# Patient Record
Sex: Female | Born: 1965
Health system: Southern US, Community
[De-identification: ages and names within clinical notes are randomized; demographics above are authoritative.]

## PROBLEM LIST (undated history)

## (undated) DIAGNOSIS — J449 Chronic obstructive pulmonary disease, unspecified: Secondary | ICD-10-CM

## (undated) DIAGNOSIS — R079 Chest pain, unspecified: Secondary | ICD-10-CM

## (undated) DIAGNOSIS — F419 Anxiety disorder, unspecified: Secondary | ICD-10-CM

## (undated) HISTORY — PX: BREAST EXCISIONAL BIOPSY: SUR124

## (undated) HISTORY — PX: APPENDECTOMY: SHX54

## (undated) HISTORY — DX: Chest pain, unspecified: R07.9

## (undated) HISTORY — DX: Chronic obstructive pulmonary disease, unspecified: J44.9

## (undated) HISTORY — DX: Anxiety disorder, unspecified: F41.9

## (undated) HISTORY — PX: BREAST SURGERY: SHX581

---

## 1998-07-25 ENCOUNTER — Other Ambulatory Visit: Admission: RE | Admit: 1998-07-25 | Discharge: 1998-07-25 | Payer: Self-pay | Admitting: Gynecology

## 2000-01-17 ENCOUNTER — Other Ambulatory Visit: Admission: RE | Admit: 2000-01-17 | Discharge: 2000-01-17 | Payer: Self-pay | Admitting: Gynecology

## 2001-11-18 ENCOUNTER — Other Ambulatory Visit: Admission: RE | Admit: 2001-11-18 | Discharge: 2001-11-18 | Payer: Self-pay | Admitting: Gynecology

## 2002-06-29 ENCOUNTER — Other Ambulatory Visit: Admission: RE | Admit: 2002-06-29 | Discharge: 2002-06-29 | Payer: Self-pay | Admitting: Gynecology

## 2003-06-07 ENCOUNTER — Encounter: Admission: RE | Admit: 2003-06-07 | Discharge: 2003-06-07 | Payer: Self-pay | Admitting: Family Medicine

## 2003-06-07 ENCOUNTER — Encounter: Payer: Self-pay | Admitting: Family Medicine

## 2003-12-30 ENCOUNTER — Other Ambulatory Visit: Admission: RE | Admit: 2003-12-30 | Discharge: 2003-12-30 | Payer: Self-pay | Admitting: Gynecology

## 2004-05-24 ENCOUNTER — Other Ambulatory Visit: Admission: RE | Admit: 2004-05-24 | Discharge: 2004-05-24 | Payer: Self-pay | Admitting: Gynecology

## 2004-12-04 ENCOUNTER — Inpatient Hospital Stay (HOSPITAL_COMMUNITY): Admission: AD | Admit: 2004-12-04 | Discharge: 2004-12-06 | Payer: Self-pay | Admitting: Gynecology

## 2005-01-26 ENCOUNTER — Other Ambulatory Visit: Admission: RE | Admit: 2005-01-26 | Discharge: 2005-01-26 | Payer: Self-pay | Admitting: Gynecology

## 2005-06-23 ENCOUNTER — Emergency Department (HOSPITAL_COMMUNITY): Admission: EM | Admit: 2005-06-23 | Discharge: 2005-06-23 | Payer: Self-pay | Admitting: Emergency Medicine

## 2005-08-15 ENCOUNTER — Ambulatory Visit: Payer: Self-pay | Admitting: Cardiology

## 2006-01-29 ENCOUNTER — Other Ambulatory Visit: Admission: RE | Admit: 2006-01-29 | Discharge: 2006-01-29 | Payer: Self-pay | Admitting: Gynecology

## 2006-05-06 ENCOUNTER — Encounter: Admission: RE | Admit: 2006-05-06 | Discharge: 2006-05-06 | Payer: Self-pay | Admitting: Gynecology

## 2006-08-28 ENCOUNTER — Other Ambulatory Visit: Admission: RE | Admit: 2006-08-28 | Discharge: 2006-08-28 | Payer: Self-pay | Admitting: Gynecology

## 2006-11-21 ENCOUNTER — Encounter: Admission: RE | Admit: 2006-11-21 | Discharge: 2006-11-21 | Payer: Self-pay | Admitting: Gynecology

## 2007-02-03 ENCOUNTER — Other Ambulatory Visit: Admission: RE | Admit: 2007-02-03 | Discharge: 2007-02-03 | Payer: Self-pay | Admitting: Gynecology

## 2008-02-05 ENCOUNTER — Other Ambulatory Visit: Admission: RE | Admit: 2008-02-05 | Discharge: 2008-02-05 | Payer: Self-pay | Admitting: Gynecology

## 2008-04-19 ENCOUNTER — Emergency Department (HOSPITAL_COMMUNITY): Admission: EM | Admit: 2008-04-19 | Discharge: 2008-04-19 | Payer: Self-pay | Admitting: Family Medicine

## 2009-03-02 ENCOUNTER — Other Ambulatory Visit: Admission: RE | Admit: 2009-03-02 | Discharge: 2009-03-02 | Payer: Self-pay | Admitting: Gynecology

## 2009-03-02 ENCOUNTER — Ambulatory Visit: Payer: Self-pay | Admitting: Gynecology

## 2009-03-02 ENCOUNTER — Encounter: Payer: Self-pay | Admitting: Gynecology

## 2009-09-08 ENCOUNTER — Encounter: Admission: RE | Admit: 2009-09-08 | Discharge: 2009-09-08 | Payer: Self-pay | Admitting: Gynecology

## 2009-11-30 ENCOUNTER — Ambulatory Visit: Payer: Self-pay | Admitting: Gynecology

## 2009-12-02 ENCOUNTER — Ambulatory Visit: Payer: Self-pay | Admitting: Gynecology

## 2010-04-17 ENCOUNTER — Other Ambulatory Visit: Admission: RE | Admit: 2010-04-17 | Discharge: 2010-04-17 | Payer: Self-pay | Admitting: Gynecology

## 2010-04-17 ENCOUNTER — Ambulatory Visit: Payer: Self-pay | Admitting: Gynecology

## 2010-05-19 ENCOUNTER — Encounter: Admission: RE | Admit: 2010-05-19 | Discharge: 2010-05-19 | Payer: Self-pay | Admitting: Gynecology

## 2010-07-20 ENCOUNTER — Emergency Department (HOSPITAL_COMMUNITY): Admission: EM | Admit: 2010-07-20 | Discharge: 2010-07-20 | Payer: Self-pay | Admitting: Obstetrics and Gynecology

## 2010-09-07 ENCOUNTER — Ambulatory Visit: Payer: Self-pay | Admitting: Internal Medicine

## 2010-09-07 DIAGNOSIS — F17200 Nicotine dependence, unspecified, uncomplicated: Secondary | ICD-10-CM | POA: Insufficient documentation

## 2010-09-07 DIAGNOSIS — R0609 Other forms of dyspnea: Secondary | ICD-10-CM | POA: Insufficient documentation

## 2010-09-07 DIAGNOSIS — Z9189 Other specified personal risk factors, not elsewhere classified: Secondary | ICD-10-CM | POA: Insufficient documentation

## 2010-09-07 DIAGNOSIS — F339 Major depressive disorder, recurrent, unspecified: Secondary | ICD-10-CM | POA: Insufficient documentation

## 2010-09-07 DIAGNOSIS — F329 Major depressive disorder, single episode, unspecified: Secondary | ICD-10-CM | POA: Insufficient documentation

## 2010-09-08 ENCOUNTER — Encounter: Payer: Self-pay | Admitting: Internal Medicine

## 2010-09-19 ENCOUNTER — Ambulatory Visit: Payer: Self-pay | Admitting: Internal Medicine

## 2010-09-20 ENCOUNTER — Encounter: Payer: Self-pay | Admitting: Internal Medicine

## 2010-09-21 ENCOUNTER — Ambulatory Visit: Payer: Self-pay | Admitting: Internal Medicine

## 2010-09-21 DIAGNOSIS — J449 Chronic obstructive pulmonary disease, unspecified: Secondary | ICD-10-CM | POA: Insufficient documentation

## 2010-09-21 DIAGNOSIS — J4489 Other specified chronic obstructive pulmonary disease: Secondary | ICD-10-CM | POA: Insufficient documentation

## 2010-11-26 ENCOUNTER — Encounter: Payer: Self-pay | Admitting: Gynecology

## 2010-12-05 NOTE — Assessment & Plan Note (Signed)
Summary: BRAND NEW PT/TO EST/CJR   Vital Signs:  Sophia profile:   45 year old female Height:      64.5 inches Weight:      142 pounds BMI:     24.08 Temp:     99.1 degrees F oral BP sitting:   116 / 78  (right arm) Cuff size:   regular  Vitals Entered By: Duard Brady LPN (September 07, 2010 1:56 PM) CC: new to establish  Is Sophia Sophia? No   CC:  new to establish .  History of Present Illness: Sophia Sophia who is in today to establish with our practice.  She presents with the chief complaint dyspnea on exertion.  She is a tobacco user since age 14.  For the past 3 months, she has had worsening dyspnea on exertion and has cut her tobacco consumption from one pack per day to the proximal effects per day.  She denies any wheezing or productive cough for a number of family members also smokers have COPD and asthma.  Two brothers are welders and one has been told that he has "black lung".  She has had no pulmonary function studies.  She was evaluated at the hospital approximately 6 weeks ago, and a chest x-ray was normal.  She is on birth control pills.  Preventive Screening-Counseling & Management  Alcohol-Tobacco     Smoking Status: current     Smoking Cessation Counseling: yes  Allergies (verified): No Known Drug Allergies  Past History:  Past Medical History: tobacco abuse dyspnea on exertion Depression  Family History: Reviewed history and no changes required. father died age 41, complications of throat cancer mother, age 68, history of COPD, status post CABG two brothers both are welders history of black lung two sisters with COPD and asthma  Social History: Reviewed history and no changes required. Married Current Smoker Smoking Status:  current  Review of Systems       The Sophia complains of dyspnea on exertion.  The Sophia denies anorexia, fever, weight loss, weight gain, vision loss, decreased hearing, hoarseness, chest pain, syncope,  peripheral edema, prolonged cough, headaches, hemoptysis, abdominal pain, melena, hematochezia, severe indigestion/heartburn, hematuria, incontinence, genital sores, muscle weakness, suspicious skin lesions, transient blindness, difficulty walking, depression, unusual weight change, abnormal bleeding, enlarged lymph nodes, angioedema, and breast masses.    Physical Exam  General:  Well-developed,well-nourished,in no acute distress; alert,appropriate and cooperative throughout examination Head:  Normocephalic and atraumatic without obvious abnormalities. No apparent alopecia or balding. Eyes:  No corneal or conjunctival inflammation noted. EOMI. Perrla. Funduscopic exam benign, without hemorrhages, exudates or papilledema. Vision grossly normal. Ears:  External ear exam shows no significant lesions or deformities.  Otoscopic examination reveals clear canals, tympanic membranes are intact bilaterally without bulging, retraction, inflammation or discharge. Hearing is grossly normal bilaterally. Mouth:  Oral mucosa and oropharynx without lesions or exudates.  Teeth in good repair. Neck:  No deformities, masses, or tenderness noted. Chest Wall:  No deformities, masses, or tenderness noted. Lungs:  Normal respiratory effort, chest expands symmetrically. Lungs are clear to auscultation, no crackles or wheezes. Heart:  Normal rate and regular rhythm. S1 and S2 normal without gallop, murmur, click, rub or other extra sounds. Abdomen:  Bowel sounds positive,abdomen soft and non-tender without masses, organomegaly or hernias noted. Msk:  No deformity or scoliosis noted of thoracic or lumbar spine.   Pulses:  R and L carotid,radial,femoral,dorsalis pedis and posterior tibial pulses are full and equal bilaterally Extremities:  No clubbing,  cyanosis, edema, or deformity noted with normal full range of motion of all joints.   Neurologic:  alert & oriented X3, strength normal in all extremities, and gait normal.     Skin:  Intact without suspicious lesions or rashes Cervical Nodes:  No lymphadenopathy noted Axillary Nodes:  No palpable lymphadenopathy Inguinal Nodes:  No significant adenopathy Psych:  Cognition and judgment appear intact. Alert and cooperative with normal attention span and concentration. No apparent delusions, illusions, hallucinations   Impression & Recommendations:  Problem # 1:  DYSPNEA ON EXERTION (ICD-786.09)  Orders: Venipuncture (78469) T-D-Dimer Fibrin Derivatives Quantitive ([id number]) Misc. Referral (Misc. Ref) Specimen Handling (44010)  Her updated medication list for this problem includes:    Proventil Hfa 108 (90 Base) Mcg/act Aers (Albuterol sulfate) .Marland Kitchen..Marland Kitchen Two puffs every 6 hours  Problem # 2:  TOBACCO ABUSE (ICD-305.1)  Problem # 3:  CHEST PAIN, ATYPICAL, HX OF (ICD-V15.89) this he is to occur more at night.  She denies any exertional chest pain.  Mother is status post CABG  Complete Medication List: 1)  Prozac 20 Mg Caps (Fluoxetine hcl) .... Qd 2)  Nora-be 0.35 Mg Tabs (Norethindrone) .... Qd 3)  Proventil Hfa 108 (90 Base) Mcg/act Aers (Albuterol sulfate) .... Two puffs every 6 hours  Other Orders: Tdap => 53yrs IM (27253) Admin 1st Vaccine (66440)  Sophia Instructions: 1)  Please schedule a follow-up appointment in 2 weeks. 2)  Advised not to eat any food or drink any liquids after 10 PM the night before your procedure. 3)  Tobacco is very bad for your health and your loved ones! You Should stop smoking!. Prescriptions: PROVENTIL HFA 108 (90 BASE) MCG/ACT AERS (ALBUTEROL SULFATE) two puffs every 6 hours  #1 x 0   Entered and Authorized by:   Gordy Savers  MD   Signed by:   Gordy Savers  MD on 09/07/2010   Method used:   Print then Give to Sophia   RxID:   [id number] PROVENTIL HFA 108 (90 BASE) MCG/ACT AERS (ALBUTEROL SULFATE) two puffs every 6 hours  #1 x 0   Entered and Authorized by:   Gordy Savers  MD   Signed  by:   Gordy Savers  MD on 09/07/2010   Method used:   Electronically to        [facility]* (retail)       [address]       [place], [place]  [address]       Ph: [phone]       Fax: [phone]   RxID:   [id number]    Orders Added: 1)  Tdap => 64yrs IM [62831] 2)  Admin 1st Vaccine [90471] 3)  New Sophia 40-64 years [99386] 4)  Venipuncture [51761] 5)  T-D-Dimer Fibrin Derivatives Quantitive [[id number]] 6)  Misc. Referral [Misc. Ref] 7)  Specimen Handling [99000]   Immunizations Administered:  Tetanus Vaccine:    Vaccine Type: Tdap    Site: left deltoid    Mfr: GlaxoSmithKline    Dose: 0.5 ml    Route: IM    Given by: Duard Brady LPN    Exp. Date: 08/24/2012    Lot #: [id number]    VIS given: 09/22/08 version given September 07, 2010.    Physician counseled: yes   Immunizations Administered:  Tetanus Vaccine:    Vaccine Type: Tdap    Site: left deltoid    Mfr: GlaxoSmithKline    Dose: 0.5 ml    Route:  IM    Given by: Duard Brady LPN    Exp. Date: 08/24/2012    Lot #: [id number]    VIS given: 09/22/08 version given September 07, 2010.    Physician counseled: yes

## 2010-12-05 NOTE — Miscellaneous (Signed)
Summary: Orders Update pft charges  Clinical Lists Changes  Orders: Added new Service order of Carbon Monoxide diffusing w/capacity (94720) - Signed Added new Service order of Lung Volumes (94240) - Signed Added new Service order of Spirometry (Pre & Post) (94060) - Signed 

## 2010-12-05 NOTE — Assessment & Plan Note (Signed)
Summary: 2 wk rov/njr   Vital Signs:  Patient profile:   45 year old female Weight:      142 pounds Temp:     98.0 degrees F oral BP sitting:   102 / 68  (left arm) Cuff size:   regular  Vitals Entered By: Duard Brady LPN (September 21, 2010 8:36 AM) CC: 2 wk rov - doing ok , c/o headache Is Patient Diabetic? No   CC:  2 wk rov - doing ok  and c/o headache.  History of Present Illness:  45 year old patient who is seen today for follow-up of her dyspnea on exertion.  Pulmonary  function studies were performed yesterday that revealed moderate COPD.  She continues to smoke, but has cut her consumption dramatically.  Pulmonary function studies revealed some improvement with bronchodilators and the patient clinically has had improvement with her metered-dose inhaler  Preventive Screening-Counseling & Management  Alcohol-Tobacco     Smoking Cessation Counseling: yes  Allergies (verified): No Known Drug Allergies  Past History:  Past Medical History: Reviewed history from 09/07/2010 and no changes required. tobacco abuse dyspnea on exertion Depression  Review of Systems       The patient complains of dyspnea on exertion.  The patient denies anorexia, fever, weight loss, weight gain, vision loss, decreased hearing, hoarseness, chest pain, syncope, peripheral edema, prolonged cough, headaches, hemoptysis, abdominal pain, melena, hematochezia, severe indigestion/heartburn, hematuria, incontinence, genital sores, muscle weakness, suspicious skin lesions, transient blindness, difficulty walking, depression, unusual weight change, abnormal bleeding, enlarged lymph nodes, angioedema, and breast masses.    Physical Exam  General:  Well-developed,well-nourished,in no acute distress; alert,appropriate and cooperative throughout examination Neck:  No deformities, masses, or tenderness noted. Lungs:  Normal respiratory effort, chest expands symmetrically. Lungs are clear to  auscultation, no crackles or wheezes. O2 saturation 98% Heart:  Normal rate and regular rhythm. S1 and S2 normal without gallop, murmur, click, rub or other extra sounds. pulse rate 64   Impression & Recommendations:  Problem # 1:  DYSPNEA ON EXERTION (ICD-786.09)  Her updated medication list for this problem includes:    Proventil Hfa 108 (90 Base) Mcg/act Aers (Albuterol sulfate) .Marland Kitchen..Marland Kitchen Two puffs every 6 hours    Dulera 100-5 Mcg/act Aero (Mometasone furo-formoterol fum) ..... One puff twice daily  Problem # 2:  CHRONIC OBSTRUCTIVE PULMONARY DISEASE (ICD-496)  Her updated medication list for this problem includes:    Proventil Hfa 108 (90 Base) Mcg/act Aers (Albuterol sulfate) .Marland Kitchen..Marland Kitchen Two puffs every 6 hours    Dulera 100-5 Mcg/act Aero (Mometasone furo-formoterol fum) ..... One puff twice daily the importance of total, and complete smoking cessation discussed at length  Complete Medication List: 1)  Prozac 20 Mg Caps (Fluoxetine hcl) .... Qd 2)  Nora-be 0.35 Mg Tabs (Norethindrone) .... Qd 3)  Proventil Hfa 108 (90 Base) Mcg/act Aers (Albuterol sulfate) .... Two puffs every 6 hours 4)  Dulera 100-5 Mcg/act Aero (Mometasone furo-formoterol fum) .... One puff twice daily  Patient Instructions: 1)  Please schedule a follow-up appointment in 6 months. 2)  Tobacco is very bad for your health and your loved ones! You Should stop smoking!. 3)  It is important that you exercise regularly at least 20 minutes 5 times a week. If you develop chest pain, have severe difficulty breathing, or feel very tired , stop exercising immediately and seek medical attention. Prescriptions: PROVENTIL HFA 108 (90 BASE) MCG/ACT AERS (ALBUTEROL SULFATE) two puffs every 6 hours  #1 x 3   Entered and  Authorized by:   Gordy Savers  MD   Signed by:   Gordy Savers  MD on 09/21/2010   Method used:   Electronically to        [facility]* (retail)       [address]       [place], [place]   [address]       Ph: [phone]       Fax: [phone]   RxID:   [id number] DULERA 100-5 MCG/ACT AERO (MOMETASONE FURO-FORMOTEROL FUM) one puff twice daily  #1 x 4   Entered and Authorized by:   Gordy Savers  MD   Signed by:   Gordy Savers  MD on 09/21/2010   Method used:   Electronically to        [facility]* (retail)       [address]       [place], [place]  [address]       Ph: [phone]       Fax: [phone]   RxID:   [id number]    Orders Added: 1)  Est. Patient Level III [88416]

## 2010-12-28 ENCOUNTER — Encounter: Payer: Self-pay | Admitting: Internal Medicine

## 2010-12-28 ENCOUNTER — Ambulatory Visit (INDEPENDENT_AMBULATORY_CARE_PROVIDER_SITE_OTHER): Payer: 59 | Admitting: Internal Medicine

## 2010-12-28 DIAGNOSIS — J329 Chronic sinusitis, unspecified: Secondary | ICD-10-CM | POA: Insufficient documentation

## 2010-12-28 MED ORDER — DOXYCYCLINE HYCLATE 100 MG PO TABS: 100.0000 mg | ORAL_TABLET | Freq: Two times a day (BID) | ORAL | Status: AC

## 2010-12-28 NOTE — Progress Notes (Signed)
  Subjective:    Patient ID: Sophia Vance, female    DOB: [birth year], 45 y.o.   MRN: [id number]  HPI Pt presents to clinic for evaluation of URI sx's. Notes 10 day h/o nasal congestion, nasal drainage and now over last several days has developed NP cough, headache, right maxillary sinus pain and green nasal discharge.  Denies fever, chills, dyspnea or wheezing. +sick exposures. Taking otc cold medication prn without significant improvement. No alleviating or exacerbating factors. Does use tobacco.   Review of Systems See HPI     Objective:   Physical Exam  Constitutional: She appears well-developed and well-nourished. No distress.  HENT:  Head: Normocephalic and atraumatic.  Right Ear: Tympanic membrane, external ear and ear canal normal.  Left Ear: Tympanic membrane, external ear and ear canal normal.  Nose: Nose normal.  Mouth/Throat: Oropharynx is clear and moist. No oropharyngeal exudate.  Eyes: Conjunctivae are normal. Right eye exhibits no discharge. Left eye exhibits no discharge. No scleral icterus.  Neck: Neck supple.  Cardiovascular: Normal rate, regular rhythm and normal heart sounds.  Exam reveals no gallop and no friction rub.   No murmur heard. Pulmonary/Chest: Effort normal and breath sounds normal. No respiratory distress. She has no wheezes. She has no rales.  Lymphadenopathy:    She has no cervical adenopathy.  Neurological: She is alert.  Skin: Skin is warm and dry. No rash noted. She is not diaphoretic. No erythema.          Assessment & Plan:

## 2010-12-28 NOTE — Assessment & Plan Note (Signed)
Begin doxycycline 100mg  po bid x 10d. Discussed appropriate otc symptom relief prn. Followup if no improvement or worsening.

## 2011-01-18 LAB — POCT CARDIAC MARKERS: CKMB, poc: <1 ng/mL — ABNORMAL LOW (ref 1.0–8.0)

## 2011-01-18 LAB — COMPREHENSIVE METABOLIC PANEL
ALT: 11 U/L (ref 0–35)
AST: 19 U/L (ref 0–37)
BUN: 9 mg/dL (ref 6–23)
CO2: 21 mEq/L (ref 19–32)
Calcium: 8.8 mg/dL (ref 8.4–10.5)
Creatinine, Ser: 0.79 mg/dL (ref 0.4–1.2)
GFR calc Af Amer: >60 mL/min (ref >60)
Potassium: 3.3 mEq/L — ABNORMAL LOW (ref 3.5–5.1)
Total Bilirubin: 0.6 mg/dL (ref 0.3–1.2)

## 2011-01-18 LAB — CBC
HCT: 38.3 % (ref 36.0–46.0)
Hemoglobin: 13.4 g/dL (ref 12.0–15.0)
MCHC: 35 g/dL (ref 30.0–36.0)
MCV: 89.3 fL (ref 78.0–100.0)
WBC: 9.7 10*3/uL (ref 4.0–10.5)

## 2011-03-22 ENCOUNTER — Ambulatory Visit (INDEPENDENT_AMBULATORY_CARE_PROVIDER_SITE_OTHER): Payer: 59 | Admitting: Internal Medicine

## 2011-03-22 ENCOUNTER — Encounter: Payer: Self-pay | Admitting: Internal Medicine

## 2011-03-22 DIAGNOSIS — J449 Chronic obstructive pulmonary disease, unspecified: Secondary | ICD-10-CM

## 2011-03-22 DIAGNOSIS — F3289 Other specified depressive episodes: Secondary | ICD-10-CM

## 2011-03-22 DIAGNOSIS — J4489 Other specified chronic obstructive pulmonary disease: Secondary | ICD-10-CM

## 2011-03-22 DIAGNOSIS — F329 Major depressive disorder, single episode, unspecified: Secondary | ICD-10-CM

## 2011-03-22 DIAGNOSIS — F17200 Nicotine dependence, unspecified, uncomplicated: Secondary | ICD-10-CM

## 2011-03-22 MED ORDER — FLUOXETINE HCL 20 MG PO TABS: 20.0000 mg | ORAL_TABLET | Freq: Every day | ORAL | Status: DC

## 2011-03-22 NOTE — Progress Notes (Signed)
  Subjective:    Patient ID: Sophia Vance, female    DOB: 02-Sep-1966, 45 y.o.   MRN: [id number]  HPI 45 year old patient who is seen today for followup. She has a history of ongoing tobacco use. She has been on maintenance Dulera which she feels has been of great benefit. She still gets mildly short of breath with activity and when she gets stressed. Denies any real active wheezing. She continues to cut her tobacco use and presently is down to 3 or 4 cigarettes daily. She is using a nicotine patch. She has a history of depression which has a well-controlled on Prozac. She has been on this medication since March of 2006. She feels that she still receives denies benefit. No new concerns or complaints denies any active wheezing   Review of Systems  Constitutional: Negative.   HENT: Negative for hearing loss, congestion, sore throat, rhinorrhea, dental problem, sinus pressure and tinnitus.   Eyes: Negative for pain, discharge and visual disturbance.  Respiratory: Positive for shortness of breath. Negative for cough.   Cardiovascular: Negative for chest pain, palpitations and leg swelling.  Gastrointestinal: Negative for nausea, vomiting, abdominal pain, diarrhea, constipation, blood in stool and abdominal distention.  Genitourinary: Negative for dysuria, urgency, frequency, hematuria, flank pain, vaginal bleeding, vaginal discharge, difficulty urinating, vaginal pain and pelvic pain.  Musculoskeletal: Negative for joint swelling, arthralgias and gait problem.  Skin: Negative for rash.  Neurological: Negative for dizziness, syncope, speech difficulty, weakness, numbness and headaches.  Hematological: Negative for adenopathy.  Psychiatric/Behavioral: Negative for behavioral problems, dysphoric mood and agitation. The patient is not nervous/anxious.        Objective:   Physical Exam  Constitutional: She is oriented to person, place, and time. She appears well-developed and well-nourished.  HENT:    Head: Normocephalic.  Right Ear: External ear normal.  Left Ear: External ear normal.  Mouth/Throat: Oropharynx is clear and moist.  Eyes: Conjunctivae and EOM are normal. Pupils are equal, round, and reactive to light.  Neck: Normal range of motion. Neck supple. No thyromegaly present.  Cardiovascular: Normal rate, regular rhythm, normal heart sounds and intact distal pulses.   Pulmonary/Chest: Effort normal and breath sounds normal. No respiratory distress. She has no wheezes. She has no rales.       O2 saturation 99%  Abdominal: Soft. Bowel sounds are normal. She exhibits no mass. There is no tenderness.  Musculoskeletal: Normal range of motion.  Lymphadenopathy:    She has no cervical adenopathy.  Neurological: She is alert and oriented to person, place, and time.  Skin: Skin is warm and dry. No rash noted.  Psychiatric: She has a normal mood and affect. Her behavior is normal.          Assessment & Plan:   Depression stable Ongoing tobacco use.  Improved  We'll continue present regimen. Samples of Dulera dispensed. He was suggested that she give herself a trial off this medication

## 2011-03-22 NOTE — Patient Instructions (Signed)
Smoking tobacco is very bad for your health. You should stop smoking immediately.    It is important that you exercise regularly, at least 20 minutes 3 to 4 times per week.  If you develop chest pain or shortness of breath seek  medical attention.  Return in 6 months for follow-up  

## 2011-05-15 ENCOUNTER — Other Ambulatory Visit: Payer: Self-pay | Admitting: Gynecology

## 2011-05-15 ENCOUNTER — Encounter (INDEPENDENT_AMBULATORY_CARE_PROVIDER_SITE_OTHER): Payer: 59 | Admitting: Gynecology

## 2011-05-15 ENCOUNTER — Other Ambulatory Visit (HOSPITAL_COMMUNITY)
Admission: RE | Admit: 2011-05-15 | Discharge: 2011-05-15 | Disposition: A | Discharge status: Home / Self Care | Payer: 59 | Source: Ambulatory Visit | Attending: Gynecology | Admitting: Gynecology

## 2011-05-15 DIAGNOSIS — Z124 Encounter for screening for malignant neoplasm of cervix: Secondary | ICD-10-CM | POA: Insufficient documentation

## 2011-05-15 DIAGNOSIS — Z01419 Encounter for gynecological examination (general) (routine) without abnormal findings: Secondary | ICD-10-CM

## 2011-05-15 DIAGNOSIS — Z13220 Encounter for screening for lipoid disorders: Secondary | ICD-10-CM

## 2011-05-15 DIAGNOSIS — Z833 Family history of diabetes mellitus: Secondary | ICD-10-CM

## 2011-08-02 LAB — DIFFERENTIAL
Basophils Absolute: 0.1
Eosinophils Absolute: 0.1
Eosinophils Relative: 1
Lymphocytes Relative: 27
Monocytes Absolute: 0.4
Monocytes Relative: 4
Neutro Abs: 7.4
Neutrophils Relative %: 67

## 2011-08-02 LAB — POCT URINALYSIS DIP (DEVICE)
Bilirubin Urine: NEGATIVE
Glucose, UA: NEGATIVE
Hgb urine dipstick: NEGATIVE
Protein, ur: NEGATIVE
Urobilinogen, UA: 0.2

## 2011-08-02 LAB — CBC
HCT: 40
Hemoglobin: 13.8
MCHC: 34.6
MCV: 91.4
RBC: 4.37

## 2011-08-02 LAB — POCT I-STAT, CHEM 8
BUN: 6
Creatinine, Ser: 0.8
Sodium: 139
TCO2: 25

## 2012-07-11 ENCOUNTER — Encounter: Payer: Self-pay | Admitting: Gynecology

## 2012-07-11 ENCOUNTER — Ambulatory Visit (INDEPENDENT_AMBULATORY_CARE_PROVIDER_SITE_OTHER): Payer: 59 | Admitting: Gynecology

## 2012-07-11 VITALS — BP 122/74 | Ht 65.0 in | Wt 149.0 lb

## 2012-07-11 DIAGNOSIS — R350 Frequency of micturition: Secondary | ICD-10-CM

## 2012-07-11 DIAGNOSIS — Z131 Encounter for screening for diabetes mellitus: Secondary | ICD-10-CM

## 2012-07-11 DIAGNOSIS — F411 Generalized anxiety disorder: Secondary | ICD-10-CM

## 2012-07-11 DIAGNOSIS — IMO0001 Reserved for inherently not codable concepts without codable children: Secondary | ICD-10-CM

## 2012-07-11 DIAGNOSIS — Z01419 Encounter for gynecological examination (general) (routine) without abnormal findings: Secondary | ICD-10-CM

## 2012-07-11 DIAGNOSIS — Z13220 Encounter for screening for lipoid disorders: Secondary | ICD-10-CM

## 2012-07-11 DIAGNOSIS — F419 Anxiety disorder, unspecified: Secondary | ICD-10-CM

## 2012-07-11 DIAGNOSIS — N63 Unspecified lump in unspecified breast: Secondary | ICD-10-CM

## 2012-07-11 LAB — CBC WITH DIFFERENTIAL/PLATELET
Basophils Absolute: 0 10*3/uL (ref 0.0–0.1)
Eosinophils Absolute: 0.1 10*3/uL (ref 0.0–0.7)
Hemoglobin: 13.7 g/dL (ref 12.0–15.0)
Lymphocytes Relative: 26 % (ref 12–46)
MCHC: 34.9 g/dL (ref 30.0–36.0)
MCV: 88.1 fL (ref 78.0–100.0)
Neutrophils Relative %: 67 % (ref 43–77)
Platelets: 310 10*3/uL (ref 150–400)
RBC: 4.45 MIL/uL (ref 3.87–5.11)
RDW: 12.6 % (ref 11.5–15.5)
WBC: 10.7 10*3/uL — ABNORMAL HIGH (ref 4.0–10.5)

## 2012-07-11 LAB — URINALYSIS W MICROSCOPIC + REFLEX CULTURE
Bilirubin Urine: NEGATIVE
Crystals: NONE SEEN
Ketones, ur: NEGATIVE mg/dL
Protein, ur: NEGATIVE mg/dL
RBC / HPF: NONE SEEN RBC/hpf (ref <3)
WBC, UA: NONE SEEN WBC/hpf (ref <3)

## 2012-07-11 LAB — LIPID PANEL
HDL: 42 mg/dL (ref >39)
LDL Cholesterol: 112 mg/dL — ABNORMAL HIGH (ref 0–99)

## 2012-07-11 MED ORDER — FLUOXETINE HCL 20 MG PO TABS: 20.0000 mg | ORAL_TABLET | Freq: Every day | ORAL | Status: DC

## 2012-07-11 MED ORDER — FLUOXETINE HCL 10 MG PO TABS: 10.0000 mg | ORAL_TABLET | Freq: Every day | ORAL | Status: DC

## 2012-07-11 NOTE — Patient Instructions (Signed)
Call to Schedule your mammogram  Facilities in [place]: 1)  [facility] [place], [address] Phone: [phone] 2)  [facility], [phone] Phone: [phone] 3)  Dr. Yolanda Bonine at [facility] [address] Phone: [phone]     Mammogram A mammogram is an X-ray test to find changes in a woman's breast. You should get a mammogram if:  You are 63 years of age or older  You have risk factors.   Your doctor recommends that you have one.  BEFORE THE TEST  Do not schedule the test the week before your period, especially if your breasts are sore during this time.  On the day of your mammogram:  Wash your breasts and armpits well. After washing, do not put on any deodorant or talcum powder on until after your test.   Eat and drink as you usually do.   Take your medicines as usual.   If you are diabetic and take insulin, make sure you:   Eat before coming for your test.   Take your insulin as usual.   If you cannot keep your appointment, call before the appointment to cancel. Schedule another appointment.  TEST  You will need to undress from the waist up. You will put on a hospital gown.   Your breast will be put on the mammogram machine, and it will press firmly on your breast with a piece of plastic called a compression paddle. This will make your breast flatter so that the machine can X-ray all parts of your breast.   Both breasts will be X-rayed. Each breast will be X-rayed from above and from the side. An X-ray might need to be taken again if the picture is not good enough.   The mammogram will last about 15 to 30 minutes.  AFTER THE TEST Finding out the results of your test Ask when your test results will be ready. Make sure you get your test results.  Document Released: 01/18/2009 Document Revised: 10/11/2011 Document Reviewed: 01/18/2009 [facility] Patient  Information 2012 [place], [place].  Consider Stop Smoking.  Help is available at [facility] smoking cessation program @ www.[place].com or 725-396-1265. OR 1-800-QUIT-NOW 413-347-6182) for free smoking cessation counseling.   Smoking Hazards Smoking cigarettes is extremely bad for your health. Tobacco smoke has over 200 known poisons in it. There are over 60 chemicals in tobacco smoke that cause cancer. Some of the chemicals found in cigarette smoke include:  Cyanide.  Benzene.  Formaldehyde.  Methanol (wood alcohol).  Acetylene (fuel used in welding torches).  Ammonia.  Cigarette smoke also contains the poisonous gases nitrogen oxide and carbon monoxide.  Cigarette smokers have an increased risk of many serious medical problems, including: Lung cancer.  Lung disease (such as pneumonia, bronchitis, and emphysema).  Heart attack and chest pain due to the heart not getting enough oxygen (angina).  Heart disease and peripheral blood vessel disease.  Hypertension.  Stroke.  Oral cancer (cancer of the lip, mouth, or voice box).  Bladder cancer.  Pancreatic cancer.  Cervical cancer.  Pregnancy complications, including premature birth.  Low birthweight babies.  Early menopause.  Lower estrogen level for women.  Infertility.  Facial wrinkles.  Blindness.  Increased risk of broken bones (fractures).  Senile dementia.  Stillbirths and smaller newborn babies, birth defects, and genetic damage to sperm.  Stomach ulcers and internal bleeding.  Children of smokers have an  increased risk of the following, because of secondhand smoke exposure:  Sudden infant death syndrome (SIDS).  Respiratory infections.  Lung cancer.  Heart disease.  Ear infections.  Smoking causes approximately: 90% of all lung cancer deaths in men.  80% of all lung cancer deaths in women.  90% of deaths from chronic obstructive lung disease.  Compared with nonsmokers, smoking increases the risk  of: Coronary heart disease by 2 to 4 times.  Stroke by 2 to 4 times.  Men developing lung cancer by 23 times.  Women developing lung cancer by 13 times.  Dying from chronic obstructive lung diseases by 12 times.  Someone who smokes 2 packs a day loses about 8 years of his or her life. Even smoking lightly shortens your life expectancy by several years. You can greatly reduce the risk of medical problems for you and your family by stopping now. Smoking is the most preventable cause of death and disease in our society. Within days of quitting smoking, your circulation returns to normal, you decrease the risk of having a heart attack, and your lung capacity improves. There may be some increased phlegm in the first few days after quitting, and it may take months for your lungs to clear up completely. Quitting for 10 years cuts your lung cancer risk to almost that of a nonsmoker. WHY IS SMOKING ADDICTIVE? Nicotine is the chemical agent in tobacco that is capable of causing addiction or dependence.  When you smoke and inhale, nicotine is absorbed rapidly into the bloodstream through your lungs. Nicotine absorbed through the lungs is capable of creating a powerful addiction. Both inhaled and non-inhaled nicotine may be addictive.  Addiction studies of cigarettes and spit tobacco show that addiction to nicotine occurs mainly during the teen years, when young people begin using tobacco products.  WHAT ARE THE BENEFITS OF QUITTING?  There are many health benefits to quitting smoking.  Likelihood of developing cancer and heart disease decreases. Health improvements are seen almost immediately.  Blood pressure, pulse rate, and breathing patterns start returning to normal soon after quitting.  People who quit may see an improvement in their overall quality of life.  Some people choose to quit all at once. Other options include nicotine replacement products, such as patches, gum, and nasal sprays. Do not use these  products without first checking with your caregiver. QUITTING SMOKING It is not easy to quit smoking. Nicotine is addicting, and longtime habits are hard to change. To start, you can write down all your reasons for quitting, tell your family and friends you want to quit, and ask for their help. Throw your cigarettes away, chew gum or cinnamon sticks, keep your hands busy, and drink extra water or juice. Go for walks and practice deep breathing to relax. Think of all the money you are saving: around $1,000 a year, for the average pack-a-day smoker. Nicotine patches and gum have been shown to improve success at efforts to stop smoking. Zyban (bupropion) is an anti-depressant drug that can be prescribed to reduce nicotine withdrawal symptoms and to suppress the urge to smoke. Smoking is an addiction with both physical and psychological effects. Joining a stop-smoking support group can help you cope with the emotional issues. For more information and advice on programs to stop smoking, call your doctor, your local hospital, or these organizations: American Lung Association - 1-800-LUNGUSA  American Cancer Society - 1-800-ACS-2345  Document Released: 11/29/2004 Document Revised: 07/04/2011 Document Reviewed: 08/03/2009 [facility] Patient Information 2012 [place], [place].  Smoking Cessation This document explains the best ways for you to quit smoking and new treatments to help. It lists new medicines that can double or triple your chances of quitting and quitting for good. It also considers ways to avoid relapses and concerns you may have about quitting, including weight gain. NICOTINE: A POWERFUL ADDICTION If you have tried to quit smoking, you know how hard it can be. It is hard because nicotine is a very addictive drug. For some people, it can be as addictive as heroin or cocaine. Usually, people make 2 or 3 tries, or more, before finally being able to quit. Each time you try to quit, you can learn about what  helps and what hurts. Quitting takes hard work and a lot of effort, but you can quit smoking. QUITTING SMOKING IS ONE OF THE MOST IMPORTANT THINGS YOU WILL EVER DO.  You will live longer, feel better, and live better.   The impact on your body of quitting smoking is felt almost immediately:   Within 20 minutes, blood pressure decreases. Pulse returns to its normal level.   After 8 hours, carbon monoxide levels in the blood return to normal. Oxygen level increases.   After 24 hours, chance of heart attack starts to decrease. Breath, hair, and body stop smelling like smoke.   After 48 hours, damaged nerve endings begin to recover. Sense of taste and smell improve.   After 72 hours, the body is virtually free of nicotine. Bronchial tubes relax and breathing becomes easier.   After 2 to 12 weeks, lungs can hold more air. Exercise becomes easier and circulation improves.   Quitting will reduce your risk of having a heart attack, stroke, cancer, or lung disease:   After 1 year, the risk of coronary heart disease is cut in half.   After 5 years, the risk of stroke falls to the same as a nonsmoker.   After 10 years, the risk of lung cancer is cut in half and the risk of other cancers decreases significantly.   After 15 years, the risk of coronary heart disease drops, usually to the level of a nonsmoker.   If you are pregnant, quitting smoking will improve your chances of having a healthy baby.   The people you live with, especially your children, will be healthier.   You will have extra money to spend on things other than cigarettes.  FIVE KEYS TO QUITTING Studies have shown that these 5 steps will help you quit smoking and quit for good. You have the best chances of quitting if you use them together: 1. Get ready.  2. Get support and encouragement.  3. Learn new skills and behaviors.  4. Get medicine to reduce your nicotine addiction and use it correctly.  5. Be prepared for relapse  or difficult situations. Be determined to continue trying to quit, even if you do not succeed at first.  1. GET READY  Set a quit date.   Change your environment.   Get rid of ALL cigarettes, ashtrays, matches, and lighters in your home, car, and place of work.   Do not let people smoke in your home.   Review your past attempts to quit. Think about what worked and what did not.   Once you quit, do not smoke. NOT EVEN A PUFF!  2. GET SUPPORT AND ENCOURAGEMENT Studies have shown that you have a better chance of being successful if you have help. You can get support in many ways.  Tell your family, friends, and coworkers that you are going to quit and need their support. Ask them not to smoke around you.   Talk to your caregivers (doctor, dentist, nurse, pharmacist, psychologist, and/or smoking counselor).   Get individual, group, or telephone counseling and support. The more counseling you have, the better your chances are of quitting. Programs are available at [employer] and health centers. Call your local health department for information about programs in your area.   Spiritual beliefs and practices may help some smokers quit.   Quit meters are Photographer that keep track of quit statistics, such as amount of "quit-time," cigarettes not smoked, and money saved.   Many smokers find one or more of the many self-help books available useful in helping them quit and stay off tobacco.  3. LEARN NEW SKILLS AND BEHAVIORS  Try to distract yourself from urges to smoke. Talk to someone, go for a walk, or occupy your time with a task.   When you first try to quit, change your routine. Take a different route to work. Drink tea instead of coffee. Eat breakfast in a different place.   Do something to reduce your stress. Take a hot bath, exercise, or read a book.   Plan something enjoyable to do every day. Reward yourself for not smoking.   Explore  interactive web-based programs that specialize in helping you quit.  4. GET MEDICINE AND USE IT CORRECTLY Medicines can help you stop smoking and decrease the urge to smoke. Combining medicine with the above behavioral methods and support can quadruple your chances of successfully quitting smoking. The U.S. Food and Drug Administration (FDA) has approved 7 medicines to help you quit smoking. These medicines fall into 3 categories.  Nicotine replacement therapy (delivers nicotine to your body without the negative effects and risks of smoking):   Nicotine gum: Available over-the-counter.   Nicotine lozenges: Available over-the-counter.   Nicotine inhaler: Available by prescription.   Nicotine nasal spray: Available by prescription.   Nicotine skin patches (transdermal): Available by prescription and over-the-counter.   Antidepressant medicine (helps people abstain from smoking, but how this works is unknown):   Bupropion sustained-release (SR) tablets: Available by prescription.   Nicotinic receptor partial agonist (simulates the effect of nicotine in your brain):   Varenicline tartrate tablets: Available by prescription.   Ask your caregiver for advice about which medicines to use and how to use them. Carefully read the information on the package.   Everyone who is trying to quit may benefit from using a medicine. If you are pregnant or trying to become pregnant, nursing an infant, you are under age 8, or you smoke fewer than 10 cigarettes per day, talk to your caregiver before taking any nicotine replacement medicines.   You should stop using a nicotine replacement product and call your caregiver if you experience nausea, dizziness, weakness, vomiting, fast or irregular heartbeat, mouth problems with the lozenge or gum, or redness or swelling of the skin around the patch that does not go away.   Do not use any other product containing nicotine while using a nicotine replacement  product.   Talk to your caregiver before using these products if you have diabetes, heart disease, asthma, stomach ulcers, you had a recent heart attack, you have high blood pressure that is not controlled with medicine, a history of irregular heartbeat, or you have been prescribed medicine to help you quit smoking.  5. BE PREPARED FOR RELAPSE  OR DIFFICULT SITUATIONS  Most relapses occur within the first 3 months after quitting. Do not be discouraged if you start smoking again. Remember, most people try several times before they finally quit.   You may have symptoms of withdrawal because your body is used to nicotine. You may crave cigarettes, be irritable, feel very hungry, cough often, get headaches, or have difficulty concentrating.   The withdrawal symptoms are only temporary. They are strongest when you first quit, but they will go away within 10 to 14 days.  Here are some difficult situations to watch for:  Alcohol. Avoid drinking alcohol. Drinking lowers your chances of successfully quitting.   Caffeine. Try to reduce the amount of caffeine you consume. It also lowers your chances of successfully quitting.   Other smokers. Being around smoking can make you want to smoke. Avoid smokers.   Weight gain. Many smokers will gain weight when they quit, usually less than 10 pounds. Eat a healthy diet and stay active. Do not let weight gain distract you from your main goal, quitting smoking. Some medicines that help you quit smoking may also help delay weight gain. You can always lose the weight gained after you quit.   Bad mood or depression. There are a lot of ways to improve your mood other than smoking.  If you are having problems with any of these situations, talk to your caregiver. SPECIAL SITUATIONS AND CONDITIONS Studies suggest that everyone can quit smoking. Your situation or condition can give you a special reason to quit.  Pregnant women/new mothers: By quitting, you protect your  baby's health and your own.   Hospitalized patients: By quitting, you reduce health problems and help healing.   Heart attack patients: By quitting, you reduce your risk of a second heart attack.   Lung, head, and neck cancer patients: By quitting, you reduce your chance of a second cancer.   Parents of children and adolescents: By quitting, you protect your children from illnesses caused by secondhand smoke.  QUESTIONS TO THINK ABOUT Think about the following questions before you try to stop smoking. You may want to talk about your answers with your caregiver.  Why do you want to quit?   If you tried to quit in the past, what helped and what did not?   What will be the most difficult situations for you after you quit? How will you plan to handle them?   Who can help you through the tough times? Your family? Friends? Caregiver?   What pleasures do you get from smoking? What ways can you still get pleasure if you quit?  Here are some questions to ask your caregiver:  How can you help me to be successful at quitting?   What medicine do you think would be best for me and how should I take it?   What should I do if I need more help?   What is smoking withdrawal like? How can I get information on withdrawal?  Quitting takes hard work and a lot of effort, but you can quit smoking. FOR MORE INFORMATION  Smokefree.gov (http://www.davis-sullivan.com/) provides free, accurate, evidence-based information and professional assistance to help support the immediate and long-term needs of people trying to quit smoking. Document Released: 10/16/2001 Document Revised: 07/04/2011 Document Reviewed: 08/08/2009 [facility] Patient Information 2012 [place], [place].

## 2012-07-11 NOTE — Progress Notes (Signed)
Makinzie Considine [birth year] [id number]        46 y.o.  G1P1 for annual exam.  Several issues noted below.  Past medical history,surgical history, medications, allergies, family history and social history were all reviewed and documented in the EPIC chart. ROS:  Was performed and pertinent positives and negatives are included in the history.  Exam: Sherrilyn Rist assistant Filed Vitals:   07/11/12 1226  BP: 122/74  Height: 5\' 5"  (1.651 m)  Weight: 149 lb (67.586 kg)   General appearance  Normal Skin grossly normal Head/Neck normal with no cervical or supraclavicular adenopathy thyroid normal Lungs  clear Cardiac RR, without RMG Abdominal  soft, nontender, without masses, organomegaly or hernia Breasts  examined lying and sitting. Right without masses, retractions, discharge or axillary adenopathy.  Left with firm mass 6:00 position periphery of breast at junction of chest wall. No overlying skin changes nipple discharge or axillary adenopathy. Pelvic  Ext/BUS/vagina  normal   Cervix  normal  Uterus  anteverted, normal size, shape and contour, midline and mobile nontender   Adnexa  Without masses or tenderness    Anus and perineum  normal   Rectovaginal  normal sphincter tone without palpated masses or tenderness.    Assessment/Plan:  46 y.o. G1P1 female for annual exam, regular menses.   1. Contraception. Patient is not sexually active and does not request contraception. We've reviewed options she is not interested as it is not an issue. Let me know if it becomes an issue. 2. Breast mass. Patient has known fibroadenoma left breast. Other small fibroadenoma right breast nonpalpable. Overdo for follow up studies. We'll schedule mammogram and ultrasound bilaterally she knows importance of follow up. SBE monthly reviewed. 3. Pap smear. The Pap smear done today. Pap smear 2012 normal. Numerous normal reports in chart with no history of abnormal Pap smears previously. We'll plan every 3-5 year  screening. 4. Anxiety/depression. Patients on Prozac 20 mg daily but feels that she could do better. Reviewed various options ultimately we decided to increase her to Prozac 30 mg daily. Side effect profile and suicide ideation reviewed. We'll follow up with me if issues with this. 5. Smoking. Patient down to 9 cigarettes daily. Reviewed options and strategies. Stop smoking literature provided. 6. Frequency. Patient is having bouts of intermittent frequency/urgency. Sounds age related. Will check UA and culture. As long as acceptable we'll follow if acceptable then we'll refer to urology rule out interstitial cystitis. 7. Health maintenance. Baseline CBC lipid profile glucose urinalysis ordered. Follow up one year, sooner as needed.    Wheeler Incorvaia P MD, 1:20 PM 07/11/2012

## 2012-07-13 LAB — URINE CULTURE: Colony Count: 3000

## 2012-07-14 ENCOUNTER — Telehealth: Payer: Self-pay | Admitting: *Deleted

## 2012-07-14 DIAGNOSIS — D249 Benign neoplasm of unspecified breast: Secondary | ICD-10-CM

## 2012-07-14 NOTE — Telephone Encounter (Signed)
Order placed for the below 

## 2012-07-14 NOTE — Telephone Encounter (Signed)
Message copied by Aura Camps on Mon Jul 14, 2012 11:02 AM ------      Message from: Keenan Bachelor      Created: Fri Jul 11, 2012  2:26 PM                   ----- Message -----         From: Dara Lords, MD         Sent: 07/11/2012   1:24 PM           To: Keenan Bachelor            Schedule bilateral mammogram/ultrasound at breast center.  Suspected bilateral fibroadenomas for follow up studies

## 2012-07-16 NOTE — Telephone Encounter (Signed)
Appointment 07/22/12 @ 11:00am

## 2012-07-17 ENCOUNTER — Telehealth: Payer: Self-pay | Admitting: *Deleted

## 2012-07-17 NOTE — Telephone Encounter (Signed)
rx for Prozac 10 mg called into pharmacy.

## 2012-07-22 ENCOUNTER — Ambulatory Visit
Admission: RE | Admit: 2012-07-22 | Discharge: 2012-07-22 | Disposition: A | Discharge status: Home / Self Care | Payer: 59 | Source: Ambulatory Visit | Attending: Gynecology | Admitting: Gynecology

## 2012-07-22 DIAGNOSIS — D249 Benign neoplasm of unspecified breast: Secondary | ICD-10-CM

## 2013-07-17 ENCOUNTER — Encounter: Payer: 59 | Admitting: Gynecology

## 2013-07-20 ENCOUNTER — Ambulatory Visit (INDEPENDENT_AMBULATORY_CARE_PROVIDER_SITE_OTHER): Payer: 59 | Admitting: Gynecology

## 2013-07-20 ENCOUNTER — Encounter: Payer: Self-pay | Admitting: Gynecology

## 2013-07-20 VITALS — BP 124/70 | Ht 65.5 in | Wt 147.0 lb

## 2013-07-20 DIAGNOSIS — Z13220 Encounter for screening for lipoid disorders: Secondary | ICD-10-CM

## 2013-07-20 DIAGNOSIS — Z01419 Encounter for gynecological examination (general) (routine) without abnormal findings: Secondary | ICD-10-CM

## 2013-07-20 DIAGNOSIS — N9089 Other specified noninflammatory disorders of vulva and perineum: Secondary | ICD-10-CM

## 2013-07-20 DIAGNOSIS — D242 Benign neoplasm of left breast: Secondary | ICD-10-CM

## 2013-07-20 DIAGNOSIS — D249 Benign neoplasm of unspecified breast: Secondary | ICD-10-CM

## 2013-07-20 LAB — CBC WITH DIFFERENTIAL/PLATELET
Eosinophils Relative: 1 % (ref 0–5)
HCT: 38.2 % (ref 36.0–46.0)
Lymphocytes Relative: 24 % (ref 12–46)
Lymphs Abs: 2.7 10*3/uL (ref 0.7–4.0)
MCV: 88.2 fL (ref 78.0–100.0)
Monocytes Absolute: 0.5 10*3/uL (ref 0.1–1.0)
Monocytes Relative: 4 % (ref 3–12)
RBC: 4.33 MIL/uL (ref 3.87–5.11)
WBC: 11.2 10*3/uL — ABNORMAL HIGH (ref 4.0–10.5)

## 2013-07-20 LAB — LIPID PANEL
Cholesterol: 175 mg/dL (ref 0–200)
HDL: 50 mg/dL (ref >39)
Total CHOL/HDL Ratio: 3.5 Ratio

## 2013-07-20 LAB — COMPREHENSIVE METABOLIC PANEL
ALT: 8 U/L (ref 0–35)
BUN: 6 mg/dL (ref 6–23)
CO2: 25 mEq/L (ref 19–32)
Calcium: 9 mg/dL (ref 8.4–10.5)
Chloride: 104 mEq/L (ref 96–112)
Creat: 0.71 mg/dL (ref 0.50–1.10)

## 2013-07-20 MED ORDER — FLUOXETINE HCL 20 MG PO TABS: 20.0000 mg | ORAL_TABLET | Freq: Every day | ORAL | Status: DC

## 2013-07-20 NOTE — Progress Notes (Signed)
Patient ID: Sophia Vance, female   DOB: 1966/10/07, 47 y.o.   MRN: [id number] Sophia Vance 07-27-1966 [id number]        47 y.o.  G1P1 for annual exam.  Several issues noted below.  Past medical history,surgical history, medications, allergies, family history and social history were all reviewed and documented in the EPIC chart.  ROS:  Performed and pertinent positives and negatives are included in the history, assessment and plan .  Exam: Kim assistant Filed Vitals:   07/20/13 1136  BP: 124/70  Height: 5' 5.5" (1.664 m)  Weight: 147 lb (66.679 kg)   General appearance  Normal Skin grossly normal Head/Neck normal with no cervical or supraclavicular adenopathy thyroid normal Lungs  clear Cardiac RR, without RMG Abdominal  soft, nontender, without masses, organomegaly or hernia Breasts  examined lying and sitting. Right without masses, retractions, discharge or axillary adenopathy. Left with 2 cm mobile firm mass 6:00 position periphery of breast cancer with chest wall. No overlying skin changes nipple discharge or axillary adenopathy Pelvic  Ext/BUS/vagina  2 classic fibroepithelial polyps as diagrammed below.  Cervix  normal  Uterus  anteverted, normal size, shape and contour, midline and mobile nontender   Adnexa  Without masses or tenderness    Anus and perineum  normal   Rectovaginal  normal sphincter tone without palpated masses or tenderness.   Physical Exam  Pulmonary/Chest:    Genitourinary:        Assessment/Plan:  47 y.o. G1P1 female for annual exam.   1. Bilateral stable fibroadenomas of the breast. Left palpable, right nonpalpable. Stable by serial mammograms/ultrasound. Patient is due for mammogram end of September I reminded her to schedule this. SBE monthly review. Is to report if any change in the palpable left fibroadenoma. 2. 2 classic vulvar skin tags. Patient will observe. As long as remains unchanged we'll monitor. If enlarges or changes she knows to report  for excision. 3. Contraception. Patient is not sexually active and does not want contraception. This has been discussed many times previously. Menses are regular monthly. 4. Constipation. Having daily bowel movements but small and difficult. Recommended FiberCon/Metamucil daily with plenty of fluids. If continues will refer to GI. 5. Anxiety. Patient doing well on Prozac 20 mg daily. Tried 30 mg but did not like the Tison she felt. Wants to continue and I refilled her x1 year. 6. Pap smear 2012. No Pap smear done today. No history of abnormal Pap smears previously. Plan repeat Pap smear next year 3 year interval. 7. Stop smoking again reviewed and strategies discussed. Down to one pack per week. 8. Health maintenance. Baseline CBC comprehensive metabolic panel lipid profile urinalysis ordered. Followup one year, sooner as needed.  Note: This document was prepared with digital dictation and possible smart phrase technology. Any transcriptional errors that result from this process are unintentional.   Dara Lords MD, 12:08 PM 07/20/2013

## 2013-07-20 NOTE — Patient Instructions (Signed)
Follow up in one year, sooner as needed. 

## 2013-07-21 LAB — URINALYSIS W MICROSCOPIC + REFLEX CULTURE
Bilirubin Urine: NEGATIVE
Casts: NONE SEEN
Crystals: NONE SEEN
Glucose, UA: NEGATIVE mg/dL
Protein, ur: NEGATIVE mg/dL
Squamous Epithelial / LPF: NONE SEEN

## 2014-07-21 ENCOUNTER — Ambulatory Visit (INDEPENDENT_AMBULATORY_CARE_PROVIDER_SITE_OTHER): Payer: 59 | Admitting: Gynecology

## 2014-07-21 ENCOUNTER — Other Ambulatory Visit: Payer: Self-pay

## 2014-07-21 ENCOUNTER — Other Ambulatory Visit (HOSPITAL_COMMUNITY)
Admission: RE | Admit: 2014-07-21 | Discharge: 2014-07-21 | Disposition: A | Discharge status: Home / Self Care | Payer: 59 | Source: Ambulatory Visit | Attending: Gynecology | Admitting: Gynecology

## 2014-07-21 ENCOUNTER — Encounter: Payer: Self-pay | Admitting: Gynecology

## 2014-07-21 ENCOUNTER — Telehealth: Payer: Self-pay | Admitting: *Deleted

## 2014-07-21 VITALS — BP 110/70 | Ht 66.0 in | Wt 153.0 lb

## 2014-07-21 DIAGNOSIS — D242 Benign neoplasm of left breast: Secondary | ICD-10-CM

## 2014-07-21 DIAGNOSIS — N9089 Other specified noninflammatory disorders of vulva and perineum: Secondary | ICD-10-CM

## 2014-07-21 DIAGNOSIS — Z01419 Encounter for gynecological examination (general) (routine) without abnormal findings: Secondary | ICD-10-CM

## 2014-07-21 DIAGNOSIS — Z1151 Encounter for screening for human papillomavirus (HPV): Secondary | ICD-10-CM | POA: Insufficient documentation

## 2014-07-21 DIAGNOSIS — N6310 Unspecified lump in the right breast, unspecified quadrant: Secondary | ICD-10-CM

## 2014-07-21 DIAGNOSIS — N63 Unspecified lump in unspecified breast: Secondary | ICD-10-CM

## 2014-07-21 DIAGNOSIS — Z1231 Encounter for screening mammogram for malignant neoplasm of breast: Secondary | ICD-10-CM

## 2014-07-21 LAB — CBC WITH DIFFERENTIAL/PLATELET
Basophils Absolute: 0 10*3/uL (ref 0.0–0.1)
Basophils Relative: 0 % (ref 0–1)
EOS PCT: 2 % (ref 0–5)
Eosinophils Absolute: 0.2 10*3/uL (ref 0.0–0.7)
HEMATOCRIT: 37.2 % (ref 36.0–46.0)
HEMOGLOBIN: 12.8 g/dL (ref 12.0–15.0)
LYMPHS ABS: 2.1 10*3/uL (ref 0.7–4.0)
LYMPHS PCT: 22 % (ref 12–46)
MCH: 30.8 pg (ref 26.0–34.0)
MCHC: 34.4 g/dL (ref 30.0–36.0)
MCV: 89.4 fL (ref 78.0–100.0)
MONO ABS: 0.8 10*3/uL (ref 0.1–1.0)
MONOS PCT: 8 % (ref 3–12)
NEUTROS ABS: 6.6 10*3/uL (ref 1.7–7.7)
Neutrophils Relative %: 68 % (ref 43–77)
Platelets: 273 10*3/uL (ref 150–400)
RBC: 4.16 MIL/uL (ref 3.87–5.11)
RDW: 13.2 % (ref 11.5–15.5)
WBC: 9.7 10*3/uL (ref 4.0–10.5)

## 2014-07-21 MED ORDER — FLUOXETINE HCL 20 MG PO TABS: 20.0000 mg | ORAL_TABLET | Freq: Every day | ORAL | Status: DC

## 2014-07-21 NOTE — Patient Instructions (Signed)
Office will call you with the biopsy results.  Follow up for the mammogram and ultrasound as scheduled.  You may obtain a copy of any labs that were done today by logging onto MyChart as outlined in the instructions provided with your AVS (after visit summary). The office will not call with normal lab results but certainly if there are any significant abnormalities then we will contact you.   Health Maintenance, Female A healthy lifestyle and preventative care can promote health and wellness.  Maintain regular health, dental, and eye exams.  Eat a healthy diet. Foods like vegetables, fruits, whole grains, low-fat dairy products, and lean protein foods contain the nutrients you need without too many calories. Decrease your intake of foods high in solid fats, added sugars, and salt. Get information about a proper diet from your caregiver, if necessary.  Regular physical exercise is one of the most important things you can do for your health. Most adults should get at least 150 minutes of moderate-intensity exercise (any activity that increases your heart rate and causes you to sweat) each week. In addition, most adults need muscle-strengthening exercises on 2 or more days a week.   Maintain a healthy weight. The body mass index (BMI) is a screening tool to identify possible weight problems. It provides an estimate of body fat based on height and weight. Your caregiver can help determine your BMI, and can help you achieve or maintain a healthy weight. For adults 20 years and older:  A BMI below 18.5 is considered underweight.  A BMI of 18.5 to 24.9 is normal.  A BMI of 25 to 29.9 is considered overweight.  A BMI of 30 and above is considered obese.  Maintain normal blood lipids and cholesterol by exercising and minimizing your intake of saturated fat. Eat a balanced diet with plenty of fruits and vegetables. Blood tests for lipids and cholesterol should begin at age 56 and be repeated every 5  years. If your lipid or cholesterol levels are high, you are over 50, or you are a high risk for heart disease, you may need your cholesterol levels checked more frequently.Ongoing high lipid and cholesterol levels should be treated with medicines if diet and exercise are not effective.  If you smoke, find out from your caregiver how to quit. If you do not use tobacco, do not start.  Lung cancer screening is recommended for adults aged 70 80 years who are at high risk for developing lung cancer because of a history of smoking. Yearly low-dose computed tomography (CT) is recommended for people who have at least a 30-pack-year history of smoking and are a current smoker or have quit within the past 15 years. A pack year of smoking is smoking an average of 1 pack of cigarettes a day for 1 year (for example: 1 pack a day for 30 years or 2 packs a day for 15 years). Yearly screening should continue until the smoker has stopped smoking for at least 15 years. Yearly screening should also be stopped for people who develop a health problem that would prevent them from having lung cancer treatment.  If you are pregnant, do not drink alcohol. If you are breastfeeding, be very cautious about drinking alcohol. If you are not pregnant and choose to drink alcohol, do not exceed 1 drink per day. One drink is considered to be 12 ounces (355 mL) of beer, 5 ounces (148 mL) of wine, or 1.5 ounces (44 mL) of liquor.  Avoid use of  street drugs. Do not share needles with anyone. Ask for help if you need support or instructions about stopping the use of drugs.  High blood pressure causes heart disease and increases the risk of stroke. Blood pressure should be checked at least every 1 to 2 years. Ongoing high blood pressure should be treated with medicines, if weight loss and exercise are not effective.  If you are 55 to 46 years old, ask your caregiver if you should take aspirin to prevent strokes.  Diabetes screening  involves taking a blood sample to check your fasting blood sugar level. This should be done once every 3 years, after age 45, if you are within normal weight and without risk factors for diabetes. Testing should be considered at a younger age or be carried out more frequently if you are overweight and have at least 1 risk factor for diabetes.  Breast cancer screening is essential preventative care for women. You should practice "breast self-awareness." This means understanding the normal appearance and feel of your breasts and may include breast self-examination. Any changes detected, no matter how small, should be reported to a caregiver. Women in their 20s and 30s should have a clinical breast exam (CBE) by a caregiver as part of a regular health exam every 1 to 3 years. After age 40, women should have a CBE every year. Starting at age 40, women should consider having a mammogram (breast X-ray) every year. Women who have a family history of breast cancer should talk to their caregiver about genetic screening. Women at a high risk of breast cancer should talk to their caregiver about having an MRI and a mammogram every year.  Breast cancer gene (BRCA)-related cancer risk assessment is recommended for women who have family members with BRCA-related cancers. BRCA-related cancers include breast, ovarian, tubal, and peritoneal cancers. Having family members with these cancers may be associated with an increased risk for harmful changes (mutations) in the breast cancer genes BRCA1 and BRCA2. Results of the assessment will determine the need for genetic counseling and BRCA1 and BRCA2 testing.  The Pap test is a screening test for cervical cancer. Women should have a Pap test starting at age 21. Between ages 21 and 29, Pap tests should be repeated every 2 years. Beginning at age 30, you should have a Pap test every 3 years as long as the past 3 Pap tests have been normal. If you had a hysterectomy for a problem that  was not cancer or a condition that could lead to cancer, then you no longer need Pap tests. If you are between ages 65 and 70, and you have had normal Pap tests going back 10 years, you no longer need Pap tests. If you have had past treatment for cervical cancer or a condition that could lead to cancer, you need Pap tests and screening for cancer for at least 20 years after your treatment. If Pap tests have been discontinued, risk factors (such as a new sexual partner) need to be reassessed to determine if screening should be resumed. Some women have medical problems that increase the chance of getting cervical cancer. In these cases, your caregiver may recommend more frequent screening and Pap tests.  The human papillomavirus (HPV) test is an additional test that may be used for cervical cancer screening. The HPV test looks for the virus that can cause the cell changes on the cervix. The cells collected during the Pap test can be tested for HPV. The HPV test could   be used to screen women aged 67 years and older, and should be used in women of any age who have unclear Pap test results. After the age of 12, women should have HPV testing at the same frequency as a Pap test.  Colorectal cancer can be detected and often prevented. Most routine colorectal cancer screening begins at the age of 26 and continues through age 56. However, your caregiver may recommend screening at an earlier age if you have risk factors for colon cancer. On a yearly basis, your caregiver may provide home test kits to check for hidden blood in the stool. Use of a small camera at the end of a tube, to directly examine the colon (sigmoidoscopy or colonoscopy), can detect the earliest forms of colorectal cancer. Talk to your caregiver about this at age 6, when routine screening begins. Direct examination of the colon should be repeated every 5 to 10 years through age 67, unless early forms of pre-cancerous polyps or small growths are  found.  Hepatitis C blood testing is recommended for all people born from [birth year] through 1963 and any individual with known risks for hepatitis C.  Practice safe sex. Use condoms and avoid high-risk sexual practices to reduce the spread of sexually transmitted infections (STIs). Sexually active women aged 8 and younger should be checked for Chlamydia, which is a common sexually transmitted infection. Older women with new or multiple partners should also be tested for Chlamydia. Testing for other STIs is recommended if you are sexually active and at increased risk.  Osteoporosis is a disease in which the bones lose minerals and strength with aging. This can result in serious bone fractures. The risk of osteoporosis can be identified using a bone density scan. Women ages 69 and over and women at risk for fractures or osteoporosis should discuss screening with their caregivers. Ask your caregiver whether you should be taking a calcium supplement or vitamin D to reduce the rate of osteoporosis.  Menopause can be associated with physical symptoms and risks. Hormone replacement therapy is available to decrease symptoms and risks. You should talk to your caregiver about whether hormone replacement therapy is right for you.  Use sunscreen. Apply sunscreen liberally and repeatedly throughout the day. You should seek shade when your shadow is shorter than you. Protect yourself by wearing long sleeves, pants, a wide-brimmed hat, and sunglasses year round, whenever you are outdoors.  Notify your caregiver of new moles or changes in moles, especially if there is a change in shape or color. Also notify your caregiver if a mole is larger than the size of a pencil eraser.  Stay current with your immunizations. Document Released: 05/07/2011 Document Revised: 02/16/2013 Document Reviewed: 05/07/2011 [facility] Patient Information 2014 [place].

## 2014-07-21 NOTE — Telephone Encounter (Signed)
Orders placed for breast center, they will contact pt to schedule.

## 2014-07-21 NOTE — Telephone Encounter (Signed)
Message copied by Thamas Jaegers on Wed Jul 21, 2014  2:24 PM ------      Message from: [name]      Created: Wed Jul 21, 2014  1:00 PM       Patient has a mammogram already scheduled at breast center.  She needs a diagnostic mammography and ultrasound reference #1 no fibroadenoma left breast 6:00 position right off of breast. #2 new firm mass right tail of Spence 3 centimeters.  ------

## 2014-07-21 NOTE — Addendum Note (Signed)
Addended by: Nelva Nay on: 07/21/2014 01:13 PM   Modules accepted: Orders

## 2014-07-21 NOTE — Progress Notes (Signed)
Sophia Vance 1966-05-09 [id number]        48 y.o.  G1P1 for annual exam.  Several issues noted below.  Past medical history,surgical history, problem list, medications, allergies, family history and social history were all reviewed and documented as reviewed in the EPIC chart.  ROS:  12 system ROS performed with pertinent positives and negatives included in the history, assessment and plan.   Additional significant findings :  none   Exam: [name] Vitals:   07/21/14 1220  BP: 110/70  Height: 5\' 6"  (1.676 m)  Weight: 153 lb (69.4 kg)   General appearance:  Normal affect, orientation and appearance. Skin: Grossly normal HEENT: Without gross lesions.  No cervical or supraclavicular adenopathy. Thyroid normal.  Lungs:  Clear without wheezing, rales or rhonchi Cardiac: RR, without RMG Abdominal:  Soft, nontender, without masses, guarding, rebound, organomegaly or hernia Breasts:  Examined lying and sitting. Right with firm nontender mobile mass tail of Spence 2-3 cm without overlying skin changes, retractions, discharge or axillary adenopathy.  Left with firm nontender to centimeter mobile mass 6:00 position off breast along chest wall without overlying skin changes, retractions, discharge or axillary adenopathy Physical Exam  Pulmonary/Chest:    Genitourinary:       Pelvic:  Ext/BUS/vagina with 2 fibroepithelial polyps as diagrammed. .phy  Cervix normal with Pap/HPV done  Uterus introverted, normal size, shape and contour, midline and mobile nontender   Adnexa  Without masses or tenderness    Anus and perineum  Normal   Rectovaginal  Normal sphincter tone without palpated masses or tenderness.   Procedure: The skin overlying and surrounding the 2 vulvar lesions were cleansed with Betadine, infiltrated with 1% lidocaine and both lesions were excised in their entirety at the level of the surrounding skin and sent to pathology separately. Silver nitrate applied  afterwards. Postoperative instructions given.   Assessment/Plan:  48 y.o. G1P1 female for annual exam with regular menses, abstinence birth control.   1. Breast mass. Patient has known probable fibroadenoma left chest wall right below the breast being followed with serial ultrasounds. Stable by exam. Right breast with new pelvic mobile firm mobile mass tail of Spence not felt before.  There was a smaller right probable fibroadenoma the is being followed that was not palpable at the 5:00 position. Patient has mammogram scheduled. Will call and make sure that they do a diagnostic mammogram with ultrasound over both areas particularly the new palpable right tail of Spence mass. Patient will follow up afterwards for recommendations. 2. 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 Have been present for a while stable. Patient does want removed now and I went ahead and did so as per above procedure note. Patient will follow up for pathology results. 3. Pap smear 2012. Pap/HPV today.  No history of significant abnormal Pap smears previously. Per current screening guidelines plan repeat at 3-5 year interval assuming this Pap smear is normal. 4. Contraception. Patient is not sexually active nor does not plan to be and declines contraception. 5. Anxiety. Patient on Prozac 20 mg daily doing well and wants to continue and I refilled her x1 year. 6. Health maintenance. Baseline CBC comprehensive metabolic panel lipid profile urinalysis ordered. Patient will follow up for her mammogram/ultrasound and her biopsy results, otherwise annually     [name] MD, 12:51 PM 07/21/2014

## 2014-07-21 NOTE — Progress Notes (Addendum)
Patient ID: Sophia Vance, female   DOB: 02-10-1966, 48 y.o.   MRN: [id number] Sophia Vance 01-05-1966 [id number]        48 y.o.  G1P1 for annual exam.  Several issues noted below.  Past medical history,surgical history, problem list, medications, allergies, family history and social history were all reviewed and documented as reviewed in the EPIC chart.  ROS:  12 system ROS performed with pertinent positives and negatives included in the history, assessment and plan.   Additional significant findings :  none   Exam: [name] Vitals:   07/21/14 1220  BP: 110/70  Height: 5\' 6"  (1.676 m)  Weight: 153 lb (69.4 kg)   General appearance:  Normal affect, orientation and appearance. Skin: Grossly normal HEENT: Without gross lesions.  No cervical or supraclavicular adenopathy. Thyroid normal.  Lungs:  Clear without wheezing, rales or rhonchi Cardiac: RR, without RMG Abdominal:  Soft, nontender, without masses, guarding, rebound, organomegaly or hernia Breasts:  Examined lying and sitting. Right with firm nontender mobile mass tail of Spence 2-3 cm without overlying skin changes, retractions, discharge or axillary adenopathy.  Left with firm nontender 2 centimeter mobile mass 6:00 position off breast along chest wall without overlying skin changes, retractions, discharge or axillary adenopathy Physical Exam  Pulmonary/Chest:    Genitourinary:       Pelvic:  Ext/BUS/vagina with 2 fibroepithelial polyps as diagrammed.  #1 upper left perineal body #2 left lower perineal body  Cervix normal with Pap/HPV done  Uterus introverted, normal size, shape and contour, midline and mobile nontender   Adnexa  Without masses or tenderness    Anus and perineum  Normal   Rectovaginal  Normal sphincter tone without palpated masses or tenderness.   Procedure: The skin overlying and surrounding the 2 vulvar lesions were cleansed with Betadine, infiltrated with 1% lidocaine and both lesions were  excised in their entirety at the level of the surrounding skin and sent to pathology separately. Silver nitrate applied afterwards. Postoperative instructions given.   Assessment/Plan:  48 y.o. G1P1 female for annual exam with regular menses, abstinence birth control.   1. Breast mass. Patient has known probable fibroadenoma left chest wall right below the breast being followed with serial ultrasounds. Stable by exam. Right breast with new pelvic mobile firm mobile mass tail of Spence not felt before.  There was a smaller right probable fibroadenoma the is being followed that was not palpable at the 5:00 position. Patient has mammogram scheduled. Will call and make sure that they do a diagnostic mammogram with ultrasound over both areas particularly the new palpable right tail of Spence mass. Patient will follow up afterwards for recommendations. 2. 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 Have been present for a while stable. Patient does want removed now and I went ahead and did so as per above procedure note. Patient will follow up for pathology results. 3. Pap smear 2012. Pap/HPV today.  No history of significant abnormal Pap smears previously. Per current screening guidelines plan repeat at 3-5 year interval assuming this Pap smear is normal. 4. Contraception. Patient is not sexually active nor does not plan to be and declines contraception. 5. Anxiety. Patient on Prozac 20 mg daily doing well and wants to continue and I refilled her x1 year. 6. Health maintenance. Baseline CBC comprehensive metabolic panel lipid profile urinalysis ordered. Patient will follow up for her mammogram/ultrasound and her biopsy results, otherwise annually     [name] MD, 1:03 PM 07/21/2014

## 2014-07-22 LAB — URINALYSIS W MICROSCOPIC + REFLEX CULTURE
BILIRUBIN URINE: NEGATIVE
Bacteria, UA: NONE SEEN
CASTS: NONE SEEN
Crystals: NONE SEEN
GLUCOSE, UA: NEGATIVE mg/dL
HGB URINE DIPSTICK: NEGATIVE
Ketones, ur: NEGATIVE mg/dL
LEUKOCYTES UA: NEGATIVE
Nitrite: NEGATIVE
PH: 7 (ref 5.0–8.0)
Protein, ur: NEGATIVE mg/dL
Specific Gravity, Urine: 1.008 (ref 1.005–1.030)
Squamous Epithelial / LPF: NONE SEEN
Urobilinogen, UA: 0.2 mg/dL (ref 0.0–1.0)

## 2014-07-22 LAB — COMPREHENSIVE METABOLIC PANEL
ALBUMIN: 4.2 g/dL (ref 3.5–5.2)
ALT: 10 U/L (ref 0–35)
AST: 14 U/L (ref 0–37)
Alkaline Phosphatase: 70 U/L (ref 39–117)
BUN: 7 mg/dL (ref 6–23)
CALCIUM: 8.9 mg/dL (ref 8.4–10.5)
CHLORIDE: 104 meq/L (ref 96–112)
CO2: 25 meq/L (ref 19–32)
Creat: 0.68 mg/dL (ref 0.50–1.10)
GLUCOSE: 77 mg/dL (ref 70–99)
POTASSIUM: 4 meq/L (ref 3.5–5.3)
Sodium: 139 mEq/L (ref 135–145)
Total Bilirubin: 0.3 mg/dL (ref 0.2–1.2)
Total Protein: 6.3 g/dL (ref 6.0–8.3)

## 2014-07-22 LAB — LIPID PANEL
CHOLESTEROL: 158 mg/dL (ref 0–200)
HDL: 49 mg/dL (ref >39)
LDL Cholesterol: 79 mg/dL (ref 0–99)
TRIGLYCERIDES: 149 mg/dL (ref <150)
Total CHOL/HDL Ratio: 3.2 Ratio
VLDL: 30 mg/dL (ref 0–40)

## 2014-07-26 LAB — CYTOLOGY - PAP

## 2014-07-30 ENCOUNTER — Ambulatory Visit
Admission: RE | Admit: 2014-07-30 | Discharge: 2014-07-30 | Disposition: A | Discharge status: Home / Self Care | Payer: 59 | Source: Ambulatory Visit

## 2014-07-30 DIAGNOSIS — Z1231 Encounter for screening mammogram for malignant neoplasm of breast: Secondary | ICD-10-CM

## 2014-08-04 NOTE — Telephone Encounter (Signed)
Appointment 08/11/14 @ 9:15 am

## 2014-08-11 ENCOUNTER — Ambulatory Visit
Admission: RE | Admit: 2014-08-11 | Discharge: 2014-08-11 | Disposition: A | Discharge status: Home / Self Care | Payer: 59 | Source: Ambulatory Visit | Attending: Gynecology | Admitting: Gynecology

## 2014-08-11 DIAGNOSIS — D242 Benign neoplasm of left breast: Secondary | ICD-10-CM

## 2014-08-11 DIAGNOSIS — N6310 Unspecified lump in the right breast, unspecified quadrant: Secondary | ICD-10-CM

## 2014-09-06 ENCOUNTER — Encounter: Payer: Self-pay | Admitting: Gynecology

## 2014-10-05 ENCOUNTER — Encounter: Payer: Self-pay | Admitting: Women's Health

## 2014-10-05 ENCOUNTER — Ambulatory Visit (INDEPENDENT_AMBULATORY_CARE_PROVIDER_SITE_OTHER): Payer: Commercial Managed Care - PPO | Admitting: Women's Health

## 2014-10-05 VITALS — BP 134/80 | Ht 66.0 in | Wt 154.0 lb

## 2014-10-05 DIAGNOSIS — B3731 Acute candidiasis of vulva and vagina: Secondary | ICD-10-CM

## 2014-10-05 DIAGNOSIS — M5441 Lumbago with sciatica, right side: Secondary | ICD-10-CM

## 2014-10-05 DIAGNOSIS — B373 Candidiasis of vulva and vagina: Secondary | ICD-10-CM

## 2014-10-05 DIAGNOSIS — N898 Other specified noninflammatory disorders of vagina: Secondary | ICD-10-CM

## 2014-10-05 LAB — WET PREP FOR TRICH, YEAST, CLUE
CLUE CELLS WET PREP: NONE SEEN
Trich, Wet Prep: NONE SEEN

## 2014-10-05 LAB — URINALYSIS W MICROSCOPIC + REFLEX CULTURE
Bilirubin Urine: NEGATIVE
GLUCOSE, UA: NEGATIVE mg/dL
HGB URINE DIPSTICK: NEGATIVE
KETONES UR: NEGATIVE mg/dL
Leukocytes, UA: NEGATIVE
Nitrite: NEGATIVE
PH: 7.5 (ref 5.0–8.0)
Protein, ur: NEGATIVE mg/dL
Specific Gravity, Urine: 1.015 (ref 1.005–1.030)
Urobilinogen, UA: 0.2 mg/dL (ref 0.0–1.0)

## 2014-10-05 MED ORDER — IBUPROFEN 600 MG PO TABS: 600.0000 mg | ORAL_TABLET | Freq: Three times a day (TID) | ORAL | Status: DC | PRN

## 2014-10-05 MED ORDER — FLUCONAZOLE 150 MG PO TABS: 150.0000 mg | ORAL_TABLET | Freq: Once | ORAL | Status: DC

## 2014-10-05 NOTE — Progress Notes (Signed)
Patient ID: Sophia Vance, female   DOB: December 23, 1965, 48 y.o.   MRN: [id number] Presents with one-day right back pain with radiation to abdomen. Reports slight nausea, no vomiting, denies pain, burning, frequency or urgency with urination. Self treated for yeast infection last week with Monistat, moderate relief of symptoms. Denies change in routine, activity, exercise, or known back injury. Denies history of kidney stones. Smokes 2-4 cigarettes daily. Drinks 4-5 cups coffee daily, several 12 oz Cokes daily. Left work for office visit, plans to return to work.  Exam: Appears uncomfortable. No left sided CVAT, right sided CVAT positive, abdomen soft, nontender no radiation of pain with abdominal palpation. External genitalia within normal limits, speculum exam scant white discharge, wet prep positive for yeast. Bimanual no CMT or adnexal fullness or tenderness. UA: Negative  Yeast vaginitis Right sided back pain <1d  Plan: Diflucan 150 by mouth 1 dose. Urine culture pending. Motrin 600 every 8 hours for pain. Increase water, decrease caffeinated beverages. Options reviewed to go to ER for further follow-up, CT of kidneys to rule out kidney stones, declined. Instructed to follow-up with primary care, ER or urgent care if pain increases, changes or persists.

## 2014-10-05 NOTE — Patient Instructions (Addendum)
Monilial Vaginitis Vaginitis in a soreness, swelling and redness (inflammation) of the vagina and vulva. Monilial vaginitis is not a sexually transmitted infection. CAUSES  Yeast vaginitis is caused by yeast (candida) that is normally found in your vagina. With a yeast infection, the candida has overgrown in number to a point that upsets the chemical balance. SYMPTOMS   White, thick vaginal discharge.  Swelling, itching, redness and irritation of the vagina and possibly the lips of the vagina (vulva).  Burning or painful urination.  Painful intercourse. DIAGNOSIS  Things that may contribute to monilial vaginitis are:  Postmenopausal and virginal states.  Pregnancy.  Infections.  Being tired, sick or stressed, especially if you had monilial vaginitis in the past.  Diabetes. Good control will help lower the chance.  Birth control pills.  Tight fitting garments.  Using bubble bath, feminine sprays, douches or deodorant tampons.  Taking certain medications that kill germs (antibiotics).  Sporadic recurrence can occur if you become ill. TREATMENT  Your caregiver will give you medication.  There are several kinds of anti monilial vaginal creams and suppositories specific for monilial vaginitis. For recurrent yeast infections, use a suppository or cream in the vagina 2 times a week, or as directed.  Anti-monilial or steroid cream for the itching or irritation of the vulva may also be used. Get your caregiver's permission.  Painting the vagina with methylene blue solution may help if the monilial cream does not work.  Eating yogurt may help prevent monilial vaginitis. HOME CARE INSTRUCTIONS   Finish all medication as prescribed.  Do not have sex until treatment is completed or after your caregiver tells you it is okay.  Take warm sitz baths.  Do not douche.  Do not use tampons, especially scented ones.  Wear cotton underwear.  Avoid tight pants and panty  hose.  Tell your sexual partner that you have a yeast infection. They should go to their caregiver if they have symptoms such as mild rash or itching.  Your sexual partner should be treated as well if your infection is difficult to eliminate.  Practice safer sex. Use condoms.  Some vaginal medications cause latex condoms to fail. Vaginal medications that harm condoms are:  Cleocin cream.  Butoconazole (Femstat).  Terconazole (Terazol) vaginal suppository.  Miconazole (Monistat) (may be purchased over the counter). SEEK MEDICAL CARE IF:   You have a temperature by mouth above 102 F (38.9 C).  The infection is getting worse after 2 days of treatment.  The infection is not getting better after 3 days of treatment.  You develop blisters in or around your vagina.  You develop vaginal bleeding, and it is not your menstrual period.  You have pain when you urinate.  You develop intestinal problems.  You have pain with sexual intercourse. Document Released: 08/01/2005 Document Revised: 01/14/2012 Document Reviewed: 04/15/2009 [facility] Patient Information 2015 [place], [place]. This information is not intended to replace advice given to you by your health care provider. Make sure you discuss any questions you have with your health care provider. Dietary Guidelines to Help Prevent Kidney Stones Your risk of kidney stones can be decreased by adjusting the foods you eat. The most important thing you can do is drink enough fluid. You should drink enough fluid to keep your urine clear or pale yellow. The following guidelines provide specific information for the type of kidney stone you have had. GUIDELINES ACCORDING TO TYPE OF KIDNEY STONE Calcium Oxalate Kidney Stones  Reduce the amount of salt you eat.  Foods that have a lot of salt cause your body to release excess calcium into your urine. The excess calcium can combine with a substance called oxalate to form kidney stones.  Reduce  the amount of animal protein you eat if the amount you eat is excessive. Animal protein causes your body to release excess calcium into your urine. Ask your dietitian how much protein from animal sources you should be eating.  Avoid foods that are high in oxalates. If you take vitamins, they should have less than 500 mg of vitamin C. Your body turns vitamin C into oxalates. You do not need to avoid fruits and vegetables high in vitamin C. Calcium Phosphate Kidney Stones  Reduce the amount of salt you eat to help prevent the release of excess calcium into your urine.  Reduce the amount of animal protein you eat if the amount you eat is excessive. Animal protein causes your body to release excess calcium into your urine. Ask your dietitian how much protein from animal sources you should be eating.  Get enough calcium from food or take a calcium supplement (ask your dietitian for recommendations). Food sources of calcium that do not increase your risk of kidney stones include:  Broccoli.  Dairy products, such as cheese and yogurt.  Pudding. Uric Acid Kidney Stones  Do not have more than 6 oz of animal protein per day. FOOD SOURCES Animal Protein Sources  Meat (all types).  Poultry.  Eggs.  Fish, seafood. Foods High in [employer] seasonings.  Soy sauce.  Teriyaki sauce.  Cured and processed meats.  Salted crackers and snack foods.  Fast food.  Canned soups and most canned foods. Foods High in Oxalates  Grains:  Amaranth.  Barley.  Grits.  Wheat germ.  Bran.  Buckwheat flour.  All bran cereals.  Pretzels.  Whole wheat bread.  Vegetables:  Beans (wax).  Beets and beet greens.  Collard greens.  Eggplant.  [place]scarole.  Leeks.  Okra.  Parsley.  Rutabagas.  Spinach.  Swiss chard.  Tomato paste.  Fried potatoes.  Sweet potatoes.  Fruits:  Red currants.  Figs.  Kiwi.  Rhubarb.  Meat and Other Protein Sources:  Beans  (dried).  Soy burgers and other soybean products.  Miso.  Nuts (peanuts, almonds, pecans, cashews, hazelnuts).  Nut butters.  Sesame seeds and tahini (paste made of sesame seeds).  Poppy seeds.  Beverages:  Chocolate drink mixes.  Soy milk.  Instant iced tea.  Juices made from high-oxalate fruits or vegetables.  Other:  Carob.  Chocolate.  Fruitcake.  Marmalades. Document Released: 02/16/2011 Document Revised: 10/27/2013 Document Reviewed: 09/18/2013 [facility] Patient Information 2015 [place], [place]. This information is not intended to replace advice given to you by your health care provider. Make sure you discuss any questions you have with your health care provider.

## 2014-10-06 LAB — URINE CULTURE
Colony Count: NO GROWTH
ORGANISM ID, BACTERIA: NO GROWTH

## 2015-07-26 ENCOUNTER — Encounter: Payer: 59 | Admitting: Gynecology

## 2015-08-02 ENCOUNTER — Ambulatory Visit: Payer: Commercial Managed Care - PPO | Admitting: Gynecology

## 2015-08-15 ENCOUNTER — Ambulatory Visit: Payer: Commercial Managed Care - PPO | Admitting: Gynecology

## 2015-08-15 ENCOUNTER — Other Ambulatory Visit: Payer: Self-pay | Admitting: *Deleted

## 2015-08-15 MED ORDER — FLUOXETINE HCL 20 MG PO TABS: 20.0000 mg | ORAL_TABLET | Freq: Every day | ORAL | Status: DC

## 2015-08-15 NOTE — Telephone Encounter (Signed)
Pharmacy called pt is requesting refill on Prozac 20 mg, annual on 09/21/15. Please advise

## 2015-09-21 ENCOUNTER — Ambulatory Visit (INDEPENDENT_AMBULATORY_CARE_PROVIDER_SITE_OTHER): Payer: Commercial Managed Care - PPO | Admitting: Gynecology

## 2015-09-21 ENCOUNTER — Encounter: Payer: Self-pay | Admitting: Gynecology

## 2015-09-21 VITALS — BP 122/78 | Ht 67.0 in | Wt 155.0 lb

## 2015-09-21 DIAGNOSIS — R5383 Other fatigue: Secondary | ICD-10-CM | POA: Diagnosis not present

## 2015-09-21 DIAGNOSIS — R3915 Urgency of urination: Secondary | ICD-10-CM | POA: Diagnosis not present

## 2015-09-21 DIAGNOSIS — D249 Benign neoplasm of unspecified breast: Secondary | ICD-10-CM | POA: Diagnosis not present

## 2015-09-21 DIAGNOSIS — Z1321 Encounter for screening for nutritional disorder: Secondary | ICD-10-CM

## 2015-09-21 DIAGNOSIS — Z13220 Encounter for screening for lipoid disorders: Secondary | ICD-10-CM

## 2015-09-21 DIAGNOSIS — Z01419 Encounter for gynecological examination (general) (routine) without abnormal findings: Secondary | ICD-10-CM | POA: Diagnosis not present

## 2015-09-21 LAB — COMPREHENSIVE METABOLIC PANEL
ALT: 10 U/L (ref 6–29)
AST: 14 U/L (ref 10–35)
Albumin: 4.1 g/dL (ref 3.6–5.1)
Alkaline Phosphatase: 84 U/L (ref 33–115)
BILIRUBIN TOTAL: 0.3 mg/dL (ref 0.2–1.2)
BUN: 8 mg/dL (ref 7–25)
CALCIUM: 9.3 mg/dL (ref 8.6–10.2)
CO2: 23 mmol/L (ref 20–31)
Chloride: 105 mmol/L (ref 98–110)
Creat: 0.76 mg/dL (ref 0.50–1.10)
GLUCOSE: 90 mg/dL (ref 65–99)
Potassium: 3.8 mmol/L (ref 3.5–5.3)
SODIUM: 139 mmol/L (ref 135–146)
Total Protein: 6.4 g/dL (ref 6.1–8.1)

## 2015-09-21 LAB — CBC WITH DIFFERENTIAL/PLATELET
BASOS ABS: 0 10*3/uL (ref 0.0–0.1)
BASOS PCT: 0 % (ref 0–1)
EOS ABS: 0.2 10*3/uL (ref 0.0–0.7)
EOS PCT: 2 % (ref 0–5)
HEMATOCRIT: 40 % (ref 36.0–46.0)
Hemoglobin: 13.7 g/dL (ref 12.0–15.0)
Lymphocytes Relative: 24 % (ref 12–46)
Lymphs Abs: 3 10*3/uL (ref 0.7–4.0)
MCH: 30.9 pg (ref 26.0–34.0)
MCHC: 34.3 g/dL (ref 30.0–36.0)
MCV: 90.1 fL (ref 78.0–100.0)
MONO ABS: 0.6 10*3/uL (ref 0.1–1.0)
MPV: 9.3 fL (ref 8.6–12.4)
Monocytes Relative: 5 % (ref 3–12)
Neutro Abs: 8.6 10*3/uL — ABNORMAL HIGH (ref 1.7–7.7)
Neutrophils Relative %: 69 % (ref 43–77)
PLATELETS: 304 10*3/uL (ref 150–400)
RBC: 4.44 MIL/uL (ref 3.87–5.11)
RDW: 13 % (ref 11.5–15.5)
WBC: 12.4 10*3/uL — AB (ref 4.0–10.5)

## 2015-09-21 LAB — LIPID PANEL
Cholesterol: 204 mg/dL — ABNORMAL HIGH (ref 125–200)
HDL: 49 mg/dL (ref >=46)
LDL CALC: 106 mg/dL (ref <130)
Total CHOL/HDL Ratio: 4.2 Ratio (ref <=5.0)
Triglycerides: 246 mg/dL — ABNORMAL HIGH (ref <150)
VLDL: 49 mg/dL — ABNORMAL HIGH (ref <30)

## 2015-09-21 NOTE — Progress Notes (Signed)
Sophia Vance [birth year] [id number]        49 y.o.  G1P1  Patient's last menstrual period was 09/06/2015. for annual exam.  Several issues noted below.  Past medical history,surgical history, problem list, medications, allergies, family history and social history were all reviewed and documented as reviewed in the EPIC chart.  ROS:  Performed with pertinent positives and negatives included in the history, assessment and plan.   Additional significant findings :  none   Exam: [name] Vitals:   09/21/15 1501  BP: 122/78  Height: 5\' 7"  (1.702 m)  Weight: 155 lb (70.308 kg)   General appearance:  Normal affect, orientation and appearance. Skin: Grossly normal HEENT: Without gross lesions.  No cervical or supraclavicular adenopathy. Thyroid normal.  Lungs:  Clear without wheezing, rales or rhonchi Cardiac: RR, without RMG Abdominal:  Soft, nontender, without masses, guarding, rebound, organomegaly or hernia Breasts:  Examined lying and sitting. Left with small mobile firm mass 6:00 position at breast and rib cage junction. Consistent with her known fibroadenoma. Otherwise without, retractions, discharge or axillary adenopathy.  Right with firm mobile mass tail of Spence consistent with her known fibroadenoma. No discharge, retractions or adenopathy. Physical Exam  Pulmonary/Chest:      Pelvic:  Ext/BUS/vagina normal  Cervix normal  Uterus anteverted, normal size, shape and contour, midline and mobile nontender   Adnexa  Without masses or tenderness    Anus and perineum  Normal   Rectovaginal  Normal sphincter tone without palpated masses or tenderness.    Assessment/Plan:  49 y.o. G1P1 female for annual exam with regular menses, abstinent birth control.   1. Fatigue.  Patient reports just being tired. No weight loss hair skin changes. Will check baseline CBC, his metabolic panel and TSH. Does not exercise which I think contributes to this. 2. Urinary urgency. Patient  notes going more frequently during the day. Notes though that she has a high caffeine input which I think is playing into this. Check urinalysis. Options for OAB medications reviewed but rejected. Patient is going to monitor for now. 3. Known fibroadenomas of the breast. Both in right and left breasts. Never followed up for the 6 month recheck as recommended last year. We'll go ahead and plan mammography with bilateral ultrasounds for stability. Her exam today shows these areas are unchanged. SBE monthly reviewed. 4. Pap smear/HPV 2015 negative.  The Pap smear done today.  No history of abnormal Pap smears previously. 5. Anxiety. Patient on Prozac 20 mg daily but wants to wean. She is actually thinking of starting Wellbutrin prescribed by her cardiologist to help her stop smoking. I discussed how to wean from the Prozac and then she is going to subsequent restarted on Wellbutrin that hopefully will help her stop smoking and address her anxiety. 6. Contraception. Patient not sexually active and not an issue at this point. Patient knows to use contraception if she does choose to be sexually active. 7. Stop smoking encouraged. She is going to start the Wellbutrin as above. Check baseline vitamin D level today. 8. Health maintenance. Baseline CBC comprehensive metabolic panel lipid profile urinalysis TSH vitamin D ordered. Follow up in one year, sooner as needed.   [name] MD, 3:27 PM 09/21/2015

## 2015-09-21 NOTE — Patient Instructions (Signed)

## 2015-09-22 LAB — URINALYSIS W MICROSCOPIC + REFLEX CULTURE
Bacteria, UA: NONE SEEN [HPF]
Bilirubin Urine: NEGATIVE
CASTS: NONE SEEN [LPF]
Crystals: NONE SEEN [HPF]
Glucose, UA: NEGATIVE
HGB URINE DIPSTICK: NEGATIVE
Ketones, ur: NEGATIVE
Leukocytes, UA: NEGATIVE
NITRITE: NEGATIVE
PH: 6 (ref 5.0–8.0)
Protein, ur: NEGATIVE
RBC / HPF: NONE SEEN RBC/HPF (ref <=2)
SPECIFIC GRAVITY, URINE: 1.018 (ref 1.001–1.035)
WBC, UA: NONE SEEN WBC/HPF (ref <=5)
YEAST: NONE SEEN [HPF]

## 2015-09-22 LAB — VITAMIN D 25 HYDROXY (VIT D DEFICIENCY, FRACTURES): VIT D 25 HYDROXY: 29 ng/mL — AB (ref 30–100)

## 2015-09-22 LAB — TSH: TSH: 1.406 u[IU]/mL (ref 0.350–4.500)

## 2015-10-24 ENCOUNTER — Institutional Professional Consult (permissible substitution): Payer: Self-pay | Admitting: Pulmonary Disease

## 2015-11-24 ENCOUNTER — Encounter: Payer: Self-pay | Admitting: *Deleted

## 2015-11-25 ENCOUNTER — Other Ambulatory Visit: Payer: Commercial Managed Care - PPO

## 2015-11-25 ENCOUNTER — Ambulatory Visit (INDEPENDENT_AMBULATORY_CARE_PROVIDER_SITE_OTHER): Payer: Commercial Managed Care - PPO | Admitting: Pulmonary Disease

## 2015-11-25 ENCOUNTER — Ambulatory Visit (INDEPENDENT_AMBULATORY_CARE_PROVIDER_SITE_OTHER)
Admission: RE | Admit: 2015-11-25 | Discharge: 2015-11-25 | Disposition: A | Discharge status: Home / Self Care | Payer: Commercial Managed Care - PPO | Source: Ambulatory Visit | Attending: Pulmonary Disease | Admitting: Pulmonary Disease

## 2015-11-25 ENCOUNTER — Encounter: Payer: Self-pay | Admitting: Pulmonary Disease

## 2015-11-25 VITALS — BP 132/64 | HR 84

## 2015-11-25 DIAGNOSIS — R0609 Other forms of dyspnea: Secondary | ICD-10-CM

## 2015-11-25 MED ORDER — TIOTROPIUM BROMIDE MONOHYDRATE 1.25 MCG/ACT IN AERS: 2.0000 | INHALATION_SPRAY | Freq: Every day | RESPIRATORY_TRACT | Status: DC

## 2015-11-25 NOTE — Patient Instructions (Signed)
We will schedule you for lung function tests. Gets some blood tests and chest x-ray Start you on Spiriva.  Return to clinic in 3 months.

## 2015-11-25 NOTE — Progress Notes (Signed)
Subjective:    Patient ID: Sophia Vance, female    DOB: April 27, 1966, 50 y.o.   MRN: [id number]  HPI Consult for evaluation of dyspnea.  Mrs. Bienaime is a 50 year old with significant smoking history. He is a diagnosis of COPD from 2011. Her chief complaint today is dyspnea on exertion which has been going on for several years. She is noticing worsening over the past year. She also has some dyspnea at rest associated with wheezing. She is nonproductive cough. She had been prescribed albuterol Qvar inhaler over the past year. She noticed some improvement in symptoms when she used these but is not using them anymore.  DATA: PFTs 09/19/10 FVC 4.28 [122%) FEV1 2.68 [99%) F/F 63 TLC 120 DLCO 96% Mild instruction, no bronchodilator response.  Echocardiogram 09/01/15 LV cavity is normal with normal global motion. EF 69% Mild MR, mild TR no evidence of pulmonary hypertension.  Social history: She smoked one pack a day for approximately 35 years. She continues to smoke about 3 cigarettes a day. Occasional alcohol use, no illegal drug use. She is works at [employer] in various positions. She reports some exposure to asbestos in the past. She is married and lives with her husband and daughter.  Family history: Mother-COPD Father- Throat cancer. Sister- Emphysema.  Past Medical History  Diagnosis Date  . Anxiety   . COPD (chronic obstructive pulmonary disease) ([place])     Current outpatient prescriptions:  .  albuterol (PROAIR HFA) 108 (90 BASE) MCG/ACT inhaler, Inhale into the lungs every 6 (six) hours as needed for wheezing or shortness of breath., Disp: , Rfl:  .  aspirin 81 MG tablet, Take 81 mg by mouth daily., Disp: , Rfl:  .  b complex vitamins tablet, Take 1 tablet by mouth daily., Disp: , Rfl:  .  buPROPion (WELLBUTRIN SR) 150 MG 12 hr tablet, Take 150 mg by mouth daily., Disp: , Rfl: 3 .  diphenhydrAMINE (BENADRYL) 25 mg capsule, Per bottle as needed for allergies,  Disp: , Rfl:  .  docusate sodium (COLACE) 100 MG capsule, Take 100 mg by mouth daily., Disp: , Rfl:  .  FLUoxetine (PROZAC) 20 MG tablet, Take 1 tablet (20 mg total) by mouth daily., Disp: 30 tablet, Rfl: 2 .  ibuprofen (ADVIL,MOTRIN) 600 MG tablet, Take 1 tablet (600 mg total) by mouth every 8 (eight) hours as needed., Disp: 60 tablet, Rfl: 1 .  vitamin B-12 (CYANOCOBALAMIN) 1000 MCG tablet, Take 1,000 mcg by mouth daily., Disp: , Rfl:   Review of Systems Dyspnea on exertion and rest, wheezing, nonproductive cough. No sputum production, hemoptysis. No fevers, chills, malaise, fatigue, loss of weight, loss of appetite. No nausea, vomiting, diarrhea, constipation. No chest pain, palpitations. All other review of systems are negative    Objective:   Physical Exam Blood pressure 132/64, pulse 84, SpO2 99 %.  Gen.: No apparent distress Neuro: No gross focal deficits. Neck: No JVD, lymphadenopathy, thyromegaly. RS: Clear, no wheeze, crackles CVS: S1-S2 heard, no murmurs rubs gallops. Abdomen: Soft, positive bowel sounds. Extremities: No edema.    Assessment & Plan:  Eval for dyspnea.  Likely secondary to COPD from smoking. She did have mild obstruction in 2011 and she continues to smoke. I will get a set of PFTs and chest x-ray for more recent assessment. She gets started on Spiriva.  She has extensive family history of COPD even though all of them were smokers. I will evaluate with alpha-1 antitrypsin levels and phenotype.  Plan: -  Start Spiriva - Pulmonary function tests, chest x-ray - Alpha-1 antitrypsin levels, phenotype - Encouraged to quit smoking.  Marshell Garfinkel MD [facility] Pager [phone] If no answer or after 3pm call: [phone] 11/25/2015, 3:09 PM

## 2015-11-25 NOTE — Addendum Note (Signed)
Addended by: Parke Poisson E on: 11/25/2015 04:45 PM   Modules accepted: Orders

## 2015-12-01 ENCOUNTER — Telehealth: Payer: Self-pay | Admitting: *Deleted

## 2015-12-01 LAB — ALPHA-1 ANTITRYPSIN PHENOTYPE: A-1 Antitrypsin: 140 mg/dL (ref 83–199)

## 2015-12-01 NOTE — Telephone Encounter (Signed)
Pharmacy called requesting Rx for Prozac 10 mg tablet. Per note on 09/21/15:    Patient on Prozac 20 mg daily but wants to wean. She is actually thinking of starting Wellbutrin prescribed by her cardiologist to help her stop smoking. I discussed how to wean from the Prozac and then she is going to subsequent restarted on Wellbutrin that hopefully will help her stop smoking and address her anxiety.  I called and asked pt to call me regarding this to discuss.

## 2015-12-16 ENCOUNTER — Telehealth: Payer: Self-pay | Admitting: Pulmonary Disease

## 2015-12-16 DIAGNOSIS — R0600 Dyspnea, unspecified: Secondary | ICD-10-CM

## 2015-12-16 NOTE — Telephone Encounter (Signed)
Result Note     Please let the pt know that her lab tests are normal. Her CXR shows a spot in the right lung. We will need to repeat the CXR PA and Lat. Please order  ---   I spoke with patient about results and she verbalized understanding and had no questions. Repeat CXR ordered.

## 2015-12-21 ENCOUNTER — Ambulatory Visit (INDEPENDENT_AMBULATORY_CARE_PROVIDER_SITE_OTHER)
Admission: RE | Admit: 2015-12-21 | Discharge: 2015-12-21 | Disposition: A | Discharge status: Home / Self Care | Payer: Commercial Managed Care - PPO | Source: Ambulatory Visit | Attending: Pulmonary Disease | Admitting: Pulmonary Disease

## 2015-12-21 DIAGNOSIS — R0600 Dyspnea, unspecified: Secondary | ICD-10-CM | POA: Diagnosis not present

## 2015-12-22 NOTE — Progress Notes (Signed)
Quick Note:  ATC x2, line busy. WCB. ______

## 2015-12-28 ENCOUNTER — Telehealth: Payer: Self-pay | Admitting: Pulmonary Disease

## 2015-12-28 ENCOUNTER — Telehealth: Payer: Self-pay | Admitting: *Deleted

## 2015-12-28 DIAGNOSIS — R911 Solitary pulmonary nodule: Secondary | ICD-10-CM

## 2015-12-28 MED ORDER — FLUOXETINE HCL 20 MG PO TABS: 20.0000 mg | ORAL_TABLET | Freq: Every day | ORAL | Status: DC

## 2015-12-28 NOTE — Telephone Encounter (Signed)
Notes Recorded by Marshell Garfinkel, MD on 12/22/2015 at 1:28 PM Can you let her know that the repeat CXR shows persistence of the spot in right lung. We will have to get a CT scan for a better look. Please order CT without contrast. --  LMTCB x1

## 2015-12-28 NOTE — Telephone Encounter (Signed)
Pt called requesting to start back on Prozac 20 mg, she did start the Wellbutrin as noted on office note 09/21/15 which her cardiologist prescribed for smoking. Pt said she is not taking Wellbutrin now nor is she smoking, she thought she could go without the Prozac but she can't. Please advise

## 2015-12-28 NOTE — Telephone Encounter (Signed)
Rx sent,

## 2015-12-28 NOTE — Telephone Encounter (Signed)
okayto restart Prozac 20 mg daily #30 with refills through her annual exam

## 2015-12-28 NOTE — Telephone Encounter (Signed)
I spoke with patient about results and she verbalized understanding and had no questions Order placed for CT scan

## 2015-12-28 NOTE — Telephone Encounter (Signed)
[phone] pt calling back

## 2016-01-02 ENCOUNTER — Ambulatory Visit (INDEPENDENT_AMBULATORY_CARE_PROVIDER_SITE_OTHER)
Admission: RE | Admit: 2016-01-02 | Discharge: 2016-01-02 | Disposition: A | Discharge status: Home / Self Care | Payer: Commercial Managed Care - PPO | Source: Ambulatory Visit | Attending: Internal Medicine | Admitting: Internal Medicine

## 2016-01-02 DIAGNOSIS — R911 Solitary pulmonary nodule: Secondary | ICD-10-CM | POA: Diagnosis not present

## 2016-01-03 ENCOUNTER — Telehealth: Payer: Self-pay | Admitting: Pulmonary Disease

## 2016-01-03 NOTE — Telephone Encounter (Signed)
Pt  Calling for results.Sophia Vance

## 2016-01-03 NOTE — Telephone Encounter (Signed)
ATC pt - line rang several times then received msg that VM is not set up yet.  WCB.

## 2016-01-03 NOTE — Telephone Encounter (Signed)
Please let her know that the CT is normal. There are no lung nodules or mass.

## 2016-01-03 NOTE — Telephone Encounter (Signed)
Pt calling for Chest CT results .  Please advise.  Results are in [place].

## 2016-01-04 NOTE — Telephone Encounter (Signed)
Pt is aware of results. Nothing further was needed at this time. 

## 2016-02-23 ENCOUNTER — Ambulatory Visit: Payer: Commercial Managed Care - PPO | Admitting: Pulmonary Disease

## 2016-02-23 ENCOUNTER — Ambulatory Visit (INDEPENDENT_AMBULATORY_CARE_PROVIDER_SITE_OTHER): Payer: Commercial Managed Care - PPO | Admitting: Pulmonary Disease

## 2016-02-23 DIAGNOSIS — R0609 Other forms of dyspnea: Secondary | ICD-10-CM

## 2016-02-23 LAB — PULMONARY FUNCTION TEST
DL/VA % pred: 71 %
DL/VA: 3.58 ml/min/mmHg/L
DLCO UNC: 20.53 ml/min/mmHg
DLCO cor % pred: 76 %
DLCO cor: 20.17 ml/min/mmHg
DLCO unc % pred: 78 %
FEF 25-75 Post: 2.11 L/sec
FEF 25-75 Pre: 1.19 L/sec
FEF2575-%Change-Post: 78 %
FEF2575-%Pred-Post: 72 %
FEF2575-%Pred-Pre: 40 %
FEV1-%Change-Post: 11 %
FEV1-%PRED-PRE: 83 %
FEV1-%Pred-Post: 92 %
FEV1-POST: 2.78 L
FEV1-PRE: 2.49 L
FEV1FVC-%Change-Post: 4 %
FEV1FVC-%Pred-Pre: 82 %
FEV6-%Change-Post: 6 %
FEV6-%PRED-POST: 106 %
FEV6-%PRED-PRE: 100 %
FEV6-POST: 3.9 L
FEV6-Pre: 3.68 L
FEV6FVC-%CHANGE-POST: 0 %
FEV6FVC-%PRED-POST: 99 %
FEV6FVC-%Pred-Pre: 100 %
FVC-%CHANGE-POST: 6 %
FVC-%PRED-POST: 106 %
FVC-%PRED-PRE: 99 %
FVC-POST: 3.99 L
FVC-PRE: 3.74 L
POST FEV6/FVC RATIO: 98 %
PRE FEV1/FVC RATIO: 67 %
PRE FEV6/FVC RATIO: 98 %
Post FEV1/FVC ratio: 70 %
RV % pred: 127 %
RV: 2.33 L
TLC % PRED: 115 %
TLC: 6.12 L

## 2016-02-23 NOTE — Progress Notes (Signed)
PFT done today. 

## 2016-02-24 ENCOUNTER — Encounter: Payer: Self-pay | Admitting: Pulmonary Disease

## 2016-02-24 ENCOUNTER — Encounter (INDEPENDENT_AMBULATORY_CARE_PROVIDER_SITE_OTHER): Payer: Self-pay

## 2016-02-24 ENCOUNTER — Ambulatory Visit (INDEPENDENT_AMBULATORY_CARE_PROVIDER_SITE_OTHER): Payer: Commercial Managed Care - PPO | Admitting: Pulmonary Disease

## 2016-02-24 VITALS — BP 124/84 | HR 77 | Ht 65.0 in | Wt 157.0 lb

## 2016-02-24 DIAGNOSIS — Z720 Tobacco use: Secondary | ICD-10-CM | POA: Diagnosis not present

## 2016-02-24 DIAGNOSIS — J439 Emphysema, unspecified: Secondary | ICD-10-CM | POA: Diagnosis not present

## 2016-02-24 MED ORDER — VARENICLINE TARTRATE 0.5 MG X 11 & 1 MG X 42 PO MISC: ORAL | Status: DC

## 2016-02-24 MED ORDER — TIOTROPIUM BROMIDE-OLODATEROL 2.5-2.5 MCG/ACT IN AERS
2.0000 | INHALATION_SPRAY | Freq: Every day | RESPIRATORY_TRACT | Status: DC
Start: 2016-02-24 — End: 2016-09-21

## 2016-02-24 NOTE — Progress Notes (Signed)
Subjective:    Patient ID: Sophia Vance, female    DOB: 21-Nov-1965, 50 y.o.   MRN: [id number] PROBLEM LIST Mild COPD Active smoker  HPI Sophia Vance is a 50 year old with significant smoking history. She has a diagnosis of COPD from 2011. Her chief complaint today is dyspnea on exertion which has been going on for several years. She is noticing worsening over the past year. She also has some dyspnea at rest associated with wheezing. She is nonproductive cough. She had been prescribed albuterol Qvar inhaler over the past year. She noticed some improvement in symptoms when she used these but is not using them anymore.  At last visit she was started on Spiriva. She says that this improves her symptoms somewhat but she is still feel she needs her albuterol inhaler everyday. She had PFTs done yesterday which showed an improvement in FEV1 post bronchodilator and she notes that she felt a whole lot better after PFTs  DATA: PFTs 09/19/10 FVC 4.28 [122%) FEV1 2.68 [99%) F/F 63 TLC 120 DLCO 96% Mild obstruction, no bronchodilator response.  PFTs 02/23/16 FVC 2.74 (99%) FEV1 2.49 (83%], Post BD 2.78 (92%) F/F 67, Post BD 70 TLC 115% DLCO 78% Mild obstructive lung disease. Worsening compared to 2011.  Echocardiogram 09/01/15 LV cavity is normal with normal global motion. EF 69% Mild MR, mild TR no evidence of pulmonary hypertension.  Ct chest 01/02/16 Emphysema. No suspicious nodules.  Social history: She smoked one pack a day for approximately 35 years. She continues to smoke about 3 cigarettes a day. Occasional alcohol use, no illegal drug use. She is works at [employer] in various positions. She reports some exposure to asbestos in the past. She is married and lives with her husband and daughter.  Family history: Mother-COPD Father- Throat cancer. Sister- Emphysema.  Past Medical History  Diagnosis Date  . Anxiety   . COPD (chronic obstructive pulmonary disease) ([place])      Current outpatient prescriptions:  .  albuterol (PROAIR HFA) 108 (90 BASE) MCG/ACT inhaler, Inhale into the lungs every 6 (six) hours as needed for wheezing or shortness of breath., Disp: , Rfl:  .  aspirin 81 MG tablet, Take 81 mg by mouth daily., Disp: , Rfl:  .  buPROPion (WELLBUTRIN SR) 150 MG 12 hr tablet, Take 150 mg by mouth daily., Disp: , Rfl: 3 .  cholecalciferol (VITAMIN D) 1000 units tablet, Take 1,000 Units by mouth daily., Disp: , Rfl:  .  diphenhydrAMINE (BENADRYL) 25 mg capsule, Per bottle as needed for allergies, Disp: , Rfl:  .  FLUoxetine (PROZAC) 20 MG tablet, Take 1 tablet (20 mg total) by mouth daily., Disp: 30 tablet, Rfl: 8 .  ibuprofen (ADVIL,MOTRIN) 600 MG tablet, Take 1 tablet (600 mg total) by mouth every 8 (eight) hours as needed., Disp: 60 tablet, Rfl: 1 .  Tiotropium Bromide Monohydrate (SPIRIVA RESPIMAT) 1.25 MCG/ACT AERS, Inhale 2 puffs into the lungs daily., Disp: 4 g, Rfl: 3 .  vitamin B-12 (CYANOCOBALAMIN) 1000 MCG tablet, Take 1,000 mcg by mouth daily., Disp: , Rfl:   Review of Systems Dyspnea on exertion and rest, wheezing, nonproductive cough. No sputum production, hemoptysis. No fevers, chills, malaise, fatigue, loss of weight, loss of appetite. No nausea, vomiting, diarrhea, constipation. No chest pain, palpitations. All other review of systems are negative    Objective:   Physical Exam Blood pressure 124/84, pulse 77, height 5\' 5"  (1.651 m), weight 157 lb (71.215 kg), SpO2 98 %. Gen.: No  apparent distress Neuro: No gross focal deficits. Neck: No JVD, lymphadenopathy, thyromegaly. RS: Clear, no wheeze, crackles CVS: S1-S2 heard, no murmurs rubs gallops. Abdomen: Soft, positive bowel sounds. Extremities: No edema.    Assessment & Plan:  Mild COPD  She has been started on Spiriva. Although she notes improvement in symptoms she still has dyspnea on exertion. There is an improvement in FEV1 on the PFTs from yesterday suggesting that she can  be further optimized with her inhalers. She would like to try Stiolto as her mother (who is also a patient of mine) is on it. I'll give her a sample and if she is happy with the results and she'll call back for a full prescription.  She has extensive family history of COPD even though all of them were smokers but alpha-1 antitrypsin levels and phenotype were normal.  CT of chest from feb 2017 shows only emphysema and no nodules. I discussed smoking cessation with her and she wants to try chantix to quit. Total time spent in counseling 74mins  Plan: - Give sample of stiolto - Encouraged to quit smoking. Start Chantix  Marshell Garfinkel MD [facility] Pager [phone] If no answer or after 3pm call: [phone] 02/24/2016, 9:23 AM

## 2016-02-24 NOTE — Patient Instructions (Signed)
We will give you a sample of stiolto  Return to clinic in 6 months

## 2016-09-19 ENCOUNTER — Other Ambulatory Visit: Payer: Self-pay | Admitting: Gynecology

## 2016-09-21 ENCOUNTER — Encounter: Payer: Self-pay | Admitting: Gynecology

## 2016-09-21 ENCOUNTER — Ambulatory Visit (INDEPENDENT_AMBULATORY_CARE_PROVIDER_SITE_OTHER): Payer: Commercial Managed Care - PPO | Admitting: Gynecology

## 2016-09-21 VITALS — BP 124/70 | Ht 65.0 in | Wt 153.0 lb

## 2016-09-21 DIAGNOSIS — Z13220 Encounter for screening for lipoid disorders: Secondary | ICD-10-CM

## 2016-09-21 DIAGNOSIS — N63 Unspecified lump in unspecified breast: Secondary | ICD-10-CM

## 2016-09-21 DIAGNOSIS — F411 Generalized anxiety disorder: Secondary | ICD-10-CM | POA: Diagnosis not present

## 2016-09-21 DIAGNOSIS — Z01411 Encounter for gynecological examination (general) (routine) with abnormal findings: Secondary | ICD-10-CM

## 2016-09-21 LAB — CBC WITH DIFFERENTIAL/PLATELET
BASOS PCT: 0 %
Basophils Absolute: 0 cells/uL (ref 0–200)
EOS ABS: 274 {cells}/uL (ref 15–500)
EOS PCT: 2 %
HCT: 38.1 % (ref 35.0–45.0)
Hemoglobin: 12.7 g/dL (ref 11.7–15.5)
LYMPHS PCT: 27 %
Lymphs Abs: 3699 cells/uL (ref 850–3900)
MCH: 28.7 pg (ref 27.0–33.0)
MCHC: 33.3 g/dL (ref 32.0–36.0)
MCV: 86 fL (ref 80.0–100.0)
MONOS PCT: 6 %
MPV: 10.1 fL (ref 7.5–12.5)
Monocytes Absolute: 822 cells/uL (ref 200–950)
NEUTROS ABS: 8905 {cells}/uL — AB (ref 1500–7800)
Neutrophils Relative %: 65 %
PLATELETS: 329 10*3/uL (ref 140–400)
RBC: 4.43 MIL/uL (ref 3.80–5.10)
RDW: 14.8 % (ref 11.0–15.0)
WBC: 13.7 10*3/uL — ABNORMAL HIGH (ref 3.8–10.8)

## 2016-09-21 LAB — LIPID PANEL
CHOL/HDL RATIO: 4.1 ratio (ref <5.0)
Cholesterol: 214 mg/dL — ABNORMAL HIGH (ref <200)
HDL: 52 mg/dL (ref >50)
LDL Cholesterol: 122 mg/dL — ABNORMAL HIGH (ref <100)
Triglycerides: 198 mg/dL — ABNORMAL HIGH (ref <150)
VLDL: 40 mg/dL — AB (ref <30)

## 2016-09-21 LAB — COMPREHENSIVE METABOLIC PANEL
ALT: 11 U/L (ref 6–29)
AST: 14 U/L (ref 10–35)
Albumin: 4.1 g/dL (ref 3.6–5.1)
Alkaline Phosphatase: 94 U/L (ref 33–115)
BUN: 7 mg/dL (ref 7–25)
CHLORIDE: 104 mmol/L (ref 98–110)
CO2: 25 mmol/L (ref 20–31)
CREATININE: 0.81 mg/dL (ref 0.50–1.10)
Calcium: 9.1 mg/dL (ref 8.6–10.2)
GLUCOSE: 91 mg/dL (ref 65–99)
POTASSIUM: 4.1 mmol/L (ref 3.5–5.3)
SODIUM: 138 mmol/L (ref 135–146)
TOTAL PROTEIN: 6.5 g/dL (ref 6.1–8.1)
Total Bilirubin: 0.2 mg/dL (ref 0.2–1.2)

## 2016-09-21 MED ORDER — FLUOXETINE HCL 20 MG PO TABS: 20.0000 mg | ORAL_TABLET | Freq: Every day | ORAL | 12 refills | Status: DC

## 2016-09-21 NOTE — Patient Instructions (Signed)
The breast center should call you to schedule the mammogram and ultrasound. Call our office if you do not hear from them within a week or so.  Less smoking is better and quitting all the [place] is best.  You may obtain a copy of any labs that were done today by logging onto MyChart as outlined in the instructions provided with your AVS (after visit summary). The office will not call with normal lab results but certainly if there are any significant abnormalities then we will contact you.   Health Maintenance Adopting a healthy lifestyle and getting preventive care can go a long [place] to promote health and wellness. Talk with your health care provider about what schedule of regular examinations is right for you. This is a good chance for you to check in with your provider about disease prevention and staying healthy. In between checkups, there are plenty of things you can do on your own. Experts have done a lot of research about which lifestyle changes and preventive measures are most likely to keep you healthy. Ask your health care provider for more information. WEIGHT AND DIET  Eat a healthy diet  Be sure to include plenty of vegetables, fruits, low-fat dairy products, and lean protein.  Do not eat a lot of foods high in solid fats, added sugars, or salt.  Get regular exercise. This is one of the most important things you can do for your health.  Most adults should exercise for at least 150 minutes each week. The exercise should increase your heart rate and make you sweat (moderate-intensity exercise).  Most adults should also do strengthening exercises at least twice a week. This is in addition to the moderate-intensity exercise.  Maintain a healthy weight  Body mass index (BMI) is a measurement that can be used to identify possible weight problems. It estimates body fat based on height and weight. Your health care provider can help determine your BMI and help you achieve or maintain a healthy  weight.  For females 63 years of age and older:   A BMI below 18.5 is considered underweight.  A BMI of 18.5 to 24.9 is normal.  A BMI of 25 to 29.9 is considered overweight.  A BMI of 30 and above is considered obese.  Watch levels of cholesterol and blood lipids  You should start having your blood tested for lipids and cholesterol at 48 years of age, then have this test every 5 years.  You may need to have your cholesterol levels checked more often if:  Your lipid or cholesterol levels are high.  You are older than 48 years of age.  You are at high risk for heart disease.  CANCER SCREENING   Lung Cancer  Lung cancer screening is recommended for adults 25-59 years old who are at high risk for lung cancer because of a history of smoking.  A yearly low-dose CT scan of the lungs is recommended for people who:  Currently smoke.  Have quit within the past 15 years.  Have at least a 30-pack-year history of smoking. A pack year is smoking an average of one pack of cigarettes a day for 1 year.  Yearly screening should continue until it has been 15 years since you quit.  Yearly screening should stop if you develop a health problem that would prevent you from having lung cancer treatment.  Breast Cancer  Practice breast self-awareness. This means understanding how your breasts normally appear and feel.  It also means doing  regular breast self-exams. Let your health care provider know about any changes, no matter how small.  If you are in your 20s or 30s, you should have a clinical breast exam (CBE) by a health care provider every 1-3 years as part of a regular health exam.  If you are 95 or older, have a CBE every year. Also consider having a breast X-ray (mammogram) every year.  If you have a family history of breast cancer, talk to your health care provider about genetic screening.  If you are at high risk for breast cancer, talk to your health care provider about  having an MRI and a mammogram every year.  Breast cancer gene (BRCA) assessment is recommended for women who have family members with BRCA-related cancers. BRCA-related cancers include:  Breast.  Ovarian.  Tubal.  Peritoneal cancers.  Results of the assessment will determine the need for genetic counseling and BRCA1 and BRCA2 testing. Cervical Cancer Routine pelvic examinations to screen for cervical cancer are no longer recommended for nonpregnant women who are considered low risk for cancer of the pelvic organs (ovaries, uterus, and vagina) and who do not have symptoms. A pelvic examination may be necessary if you have symptoms including those associated with pelvic infections. Ask your health care provider if a screening pelvic exam is right for you.   The Pap test is the screening test for cervical cancer for women who are considered at risk.  If you had a hysterectomy for a problem that was not cancer or a condition that could lead to cancer, then you no longer need Pap tests.  If you are older than 65 years, and you have had normal Pap tests for the past 10 years, you no longer need to have Pap tests.  If you have had past treatment for cervical cancer or a condition that could lead to cancer, you need Pap tests and screening for cancer for at least 20 years after your treatment.  If you no longer get a Pap test, assess your risk factors if they change (such as having a new sexual partner). This can affect whether you should start being screened again.  Some women have medical problems that increase their chance of getting cervical cancer. If this is the case for you, your health care provider may recommend more frequent screening and Pap tests.  The human papillomavirus (HPV) test is another test that may be used for cervical cancer screening. The HPV test looks for the virus that can cause cell changes in the cervix. The cells collected during the Pap test can be tested for  HPV.  The HPV test can be used to screen women 77 years of age and older. Getting tested for HPV can extend the interval between normal Pap tests from three to five years.  An HPV test also should be used to screen women of any age who have unclear Pap test results.  After 48 years of age, women should have HPV testing as often as Pap tests.  Colorectal Cancer  This type of cancer can be detected and often prevented.  Routine colorectal cancer screening usually begins at 48 years of age and continues through 48 years of age.  Your health care provider may recommend screening at an earlier age if you have risk factors for colon cancer.  Your health care provider may also recommend using home test kits to check for hidden blood in the stool.  A small camera at the end of a tube  can be used to examine your colon directly (sigmoidoscopy or colonoscopy). This is done to check for the earliest forms of colorectal cancer.  Routine screening usually begins at age 9.  Direct examination of the colon should be repeated every 5-10 years through 48 years of age. However, you may need to be screened more often if early forms of precancerous polyps or small growths are found. Skin Cancer  Check your skin from head to toe regularly.  Tell your health care provider about any new moles or changes in moles, especially if there is a change in a mole's shape or color.  Also tell your health care provider if you have a mole that is larger than the size of a pencil eraser.  Always use sunscreen. Apply sunscreen liberally and repeatedly throughout the day.  Protect yourself by wearing long sleeves, pants, a wide-brimmed hat, and sunglasses whenever you are outside. HEART DISEASE, DIABETES, AND HIGH BLOOD PRESSURE   Have your blood pressure checked at least every 1-2 years. High blood pressure causes heart disease and increases the risk of stroke.  If you are between 30 years and 76 years old, ask  your health care provider if you should take aspirin to prevent strokes.  Have regular diabetes screenings. This involves taking a blood sample to check your fasting blood sugar level.  If you are at a normal weight and have a low risk for diabetes, have this test once every three years after 48 years of age.  If you are overweight and have a high risk for diabetes, consider being tested at a younger age or more often. PREVENTING INFECTION  Hepatitis B  If you have a higher risk for hepatitis B, you should be screened for this virus. You are considered at high risk for hepatitis B if:  You were born in a country where hepatitis B is common. Ask your health care provider which countries are considered high risk.  Your parents were born in a high-risk country, and you have not been immunized against hepatitis B (hepatitis B vaccine).  You have HIV or AIDS.  You use needles to inject street drugs.  You live with someone who has hepatitis B.  You have had sex with someone who has hepatitis B.  You get hemodialysis treatment.  You take certain medicines for conditions, including cancer, organ transplantation, and autoimmune conditions. Hepatitis C  Blood testing is recommended for:  Everyone born from [birth year] through 1963.  Anyone with known risk factors for hepatitis C. Sexually transmitted infections (STIs)  You should be screened for sexually transmitted infections (STIs) including gonorrhea and chlamydia if:  You are sexually active and are younger than 48 years of age.  You are older than 48 years of age and your health care provider tells you that you are at risk for this type of infection.  Your sexual activity has changed since you were last screened and you are at an increased risk for chlamydia or gonorrhea. Ask your health care provider if you are at risk.  If you do not have HIV, but are at risk, it may be recommended that you take a prescription medicine daily to  prevent HIV infection. This is called pre-exposure prophylaxis (PrEP). You are considered at risk if:  You are sexually active and do not regularly use condoms or know the HIV status of your partner(s).  You take drugs by injection.  You are sexually active with a partner who has HIV. Talk with your  health care provider about whether you are at high risk of being infected with HIV. If you choose to begin PrEP, you should first be tested for HIV. You should then be tested every 3 months for as long as you are taking PrEP.  PREGNANCY   If you are premenopausal and you may become pregnant, ask your health care provider about preconception counseling.  If you may become pregnant, take 400 to 800 micrograms (mcg) of folic acid every day.  If you want to prevent pregnancy, talk to your health care provider about birth control (contraception). OSTEOPOROSIS AND MENOPAUSE   Osteoporosis is a disease in which the bones lose minerals and strength with aging. This can result in serious bone fractures. Your risk for osteoporosis can be identified using a bone density scan.  If you are 86 years of age or older, or if you are at risk for osteoporosis and fractures, ask your health care provider if you should be screened.  Ask your health care provider whether you should take a calcium or vitamin D supplement to lower your risk for osteoporosis.  Menopause may have certain physical symptoms and risks.  Hormone replacement therapy may reduce some of these symptoms and risks. Talk to your health care provider about whether hormone replacement therapy is right for you.  HOME CARE INSTRUCTIONS   Schedule regular health, dental, and eye exams.  Stay current with your immunizations.   Do not use any tobacco products including cigarettes, chewing tobacco, or electronic cigarettes.  If you are pregnant, do not drink alcohol.  If you are breastfeeding, limit how much and how often you drink  alcohol.  Limit alcohol intake to no more than 1 drink per day for nonpregnant women. One drink equals 12 ounces of beer, 5 ounces of wine, or 1 ounces of hard liquor.  Do not use street drugs.  Do not share needles.  Ask your health care provider for help if you need support or information about quitting drugs.  Tell your health care provider if you often feel depressed.  Tell your health care provider if you have ever been abused or do not feel safe at home. Document Released: 05/07/2011 Document Revised: 03/08/2014 Document Reviewed: 09/23/2013 [facility] Patient Information 2015 [place], [place]. This information is not intended to replace advice given to you by your health care provider. Make sure you discuss any questions you have with your health care provider.

## 2016-09-21 NOTE — Progress Notes (Signed)
Unique Kaseman 1966-05-04 [id number]        50 y.o.  G1P1  for annual exam.    Patient also complaining of a new area that she is feeling in her left breast. She has 2 areas that were previously outlined one in the left and one in the right breast which were felt to be fibroadenomas. She was to follow up for short interval studies but never did so. The newer finding is tender to her palpation. Has been present for several months.  Past medical history,surgical history, problem list, medications, allergies, family history and social history were all reviewed and documented as reviewed in the EPIC chart.  ROS:  Performed with pertinent positives and negatives included in the history, assessment and plan.   Additional significant findings :  None   Exam: Caryn Bee assistant Vitals:   09/21/16 1551  BP: 124/70  Weight: 153 lb (69.4 kg)  Height: 5\' 5"  (1.651 m)   Body mass index is 25.46 kg/m.  General appearance:  Normal affect, orientation and appearance. Skin: Grossly normal HEENT: Without gross lesions.  No cervical or supraclavicular adenopathy. Thyroid normal.  Lungs:  Clear without wheezing, rales or rhonchi Cardiac: RR, without RMG Abdominal:  Soft, nontender, without masses, guarding, rebound, organomegaly or hernia Breasts:  Examined lying and sitting.Right with subtle firm 2 cm nodule 10:00 position several fingerbreadths off the areola. No overlying skin changes. No other masses, retractions or axillary adenopathy. Left with 2 cm firm nodule 8 to 9:00 position peripherally of breast at junction with chest wall. Prior 6:00 nodule at periphery of breast not clearly palpated. No other masses, retractions, discharge or axillary adenopathy. Physical Exam  Pulmonary/Chest:      Pelvic:  Ext, BUS, Vagina normal  Cervix normal  Uterus anteverted, normal size, shape and contour, midline and mobile nontender   Adnexa without masses or tenderness    Anus and perineum normal    Rectovaginal normal sphincter tone without palpated masses or tenderness.    Assessment/Plan:  50 y.o. G1P1 female for annual exam with regular menses, no contraception.   1. History of to probable fibroadenomas one in the right breast as diagrammed and one in the left breast marked by the X at 6:00. I cannot clearly palpate this area. She has a separate clearly palpated small nodule at the 8 to 9:00 position. This is the one she feels is new to her. It feels more lateral to where the prior one was diagrammed and probably represents a new change. Discussed differential with her. It does feel well circumscribed and firm consistent with a fibroadenoma. We'll plan on diagnostic mammography and ultrasound of all the areas outlined above. Will schedule at the breast center. She knows to call us if she does not hear from them within 1-2 weeks. 2. Anxiety. On Prozac 20 mg daily doing well. Has been on this for some time and wants to continue. Refill 1 year provided. 3. Continues to smoke. I reviewed with her the need to stop smoking and the dangers of smoking. Patient states I only smoke a little. I reviewed with her less is better but stopping altogether is best. Patient acknowledges our discussion. 4. Contraception. Patient rarely sexually active. I reviewed with her the risks of pregnancy regardless of being rarely sexually. Need for birth control discussed. The need at least to use condoms with Plan B back up if not a more consistent contraception. Patient not interested in doing anything more at this point.  5. Pap smear/HPV 2015. No Pap smear done today. No history of abnormal Pap smears previously. 6. Health maintenance. Patient requests baseline labs. CBC, CMP, lipid profile, urinalysis ordered. Follow up for breast studies as outlined above. Follow up for annual exam in one year.  Additional time in excess of her routine gynecologic exam was spent in direct face to face counseling and coordination  of care in regards to her breast masses and discussion of evaluation.[name] MD, 4:18 PM 09/21/2016

## 2016-09-22 LAB — URINALYSIS W MICROSCOPIC + REFLEX CULTURE
BILIRUBIN URINE: NEGATIVE
Bacteria, UA: NONE SEEN [HPF]
Casts: NONE SEEN [LPF]
Crystals: NONE SEEN [HPF]
GLUCOSE, UA: NEGATIVE
Hgb urine dipstick: NEGATIVE
KETONES UR: NEGATIVE
LEUKOCYTES UA: NEGATIVE
NITRITE: NEGATIVE
PH: 6.5 (ref 5.0–8.0)
Protein, ur: NEGATIVE
RBC / HPF: NONE SEEN RBC/HPF (ref <=2)
SPECIFIC GRAVITY, URINE: 1.016 (ref 1.001–1.035)
Yeast: NONE SEEN [HPF]

## 2016-09-23 LAB — URINE CULTURE: Organism ID, Bacteria: NO GROWTH

## 2016-09-24 ENCOUNTER — Telehealth: Payer: Self-pay | Admitting: *Deleted

## 2016-09-24 DIAGNOSIS — N63 Unspecified lump in unspecified breast: Secondary | ICD-10-CM

## 2016-09-24 NOTE — Telephone Encounter (Signed)
Appointment on 10/01/16 @ 12:20pm left message for pt to call.

## 2016-09-24 NOTE — Telephone Encounter (Signed)
-----   Message from [name], MD sent at 09/21/2016  4:29 PM EST ----- Schedule diagnostic mammography and ultrasound at the breast center. Reference: nodule right breast 10  position several fingerbreadths off areola previously noted on studies. History of prior nodule left breast 6:00 periphery of breast not clearly palpated today. New area at 8 to 9:00 periphery of left breast palpated today

## 2016-09-24 NOTE — Telephone Encounter (Signed)
Pt informed with the below note,  

## 2016-09-24 NOTE — Telephone Encounter (Signed)
Orders placed breast center, they will contact pt to schedule.

## 2016-10-01 ENCOUNTER — Ambulatory Visit
Admission: RE | Admit: 2016-10-01 | Discharge: 2016-10-01 | Disposition: A | Discharge status: Home / Self Care | Payer: Commercial Managed Care - PPO | Source: Ambulatory Visit | Attending: Gynecology | Admitting: Gynecology

## 2016-10-01 DIAGNOSIS — N63 Unspecified lump in unspecified breast: Secondary | ICD-10-CM

## 2016-10-17 ENCOUNTER — Encounter: Payer: Self-pay | Admitting: Gynecology

## 2016-10-17 ENCOUNTER — Ambulatory Visit (INDEPENDENT_AMBULATORY_CARE_PROVIDER_SITE_OTHER): Payer: Commercial Managed Care - PPO | Admitting: Gynecology

## 2016-10-17 ENCOUNTER — Ambulatory Visit: Payer: Commercial Managed Care - PPO

## 2016-10-17 VITALS — BP 118/76

## 2016-10-17 DIAGNOSIS — Z309 Encounter for contraceptive management, unspecified: Secondary | ICD-10-CM | POA: Diagnosis not present

## 2016-10-17 MED ORDER — MEDROXYPROGESTERONE ACETATE 150 MG/ML IM SUSP
150.0000 mg | Freq: Once | INTRAMUSCULAR | Status: AC
  Administered 2016-10-17: 150 mg via INTRAMUSCULAR

## 2016-10-17 NOTE — Patient Instructions (Signed)
Follow up for your next Depo-Provera shot in 3 months.  Follow up for Nexplanon placement if you choose to do so.

## 2016-10-17 NOTE — Addendum Note (Signed)
Addended by: Nelva Nay on: 10/17/2016 12:57 PM   Modules accepted: Orders

## 2016-10-17 NOTE — Progress Notes (Signed)
    Sophia Vance 08-14-1966 [id number]        50 y.o.  G1P1 presents for Depo-Provera initially without scheduled appointment. I recently saw her for her annual exam and we discussed contraception in general. Was not interested in pursuing anything at that time other than barrier methods. Has now decided she wants Depo-Provera.  Past medical history,surgical history, problem list, medications, allergies, family history and social history were all reviewed and documented in the EPIC chart.  Directed ROS with pertinent positives and negatives documented in the history of present illness/assessment and plan.  Exam: Vitals:   10/17/16 1227  BP: 118/76   General appearance:  Normal   Assessment/Plan:  50 y.o. G1P1 For contraceptive discussion. I reviewed with her all forms of contraception available to her. She does smoke and I feel estrogen-containing products not wise such as birth control pills. I reviewed the issues with Depo-Provera to include a possible slightly higher risk of thrombosis such as stroke heart attack DVT. Irregular bleeding or no bleeding also reviewed. I discussed Nexplanon and it's risks versus benefits.  Erratic bleeding issues discussed. We also reviewed IUDs. Patient did have an IUD in the past and had a bad experience is not interested in this. Lastly I discussed sterilization. At this point the patient wants to go with Depo-Provera but may look into Nexplanon. Her last menstrual period was last week but she reports no intercourse since her last menses. Need to follow up in 3 months for next Depo-Provera reviewed.    [name] MD, 12:44 PM 10/17/2016

## 2017-01-10 ENCOUNTER — Telehealth: Payer: Self-pay | Admitting: *Deleted

## 2017-01-10 MED ORDER — MEDROXYPROGESTERONE ACETATE 150 MG/ML IM SUSP: 150.0000 mg | Freq: Once | INTRAMUSCULAR | 0 refills | Status: DC

## 2017-01-10 NOTE — Telephone Encounter (Signed)
Pt depo provera was never sent to pharmacy on 10/17/16 visit. Rx will be sent now

## 2017-01-15 ENCOUNTER — Ambulatory Visit: Payer: Commercial Managed Care - PPO

## 2017-01-21 ENCOUNTER — Ambulatory Visit (INDEPENDENT_AMBULATORY_CARE_PROVIDER_SITE_OTHER): Payer: Commercial Managed Care - PPO | Admitting: Anesthesiology

## 2017-01-21 DIAGNOSIS — Z3042 Encounter for surveillance of injectable contraceptive: Secondary | ICD-10-CM | POA: Diagnosis not present

## 2017-01-21 MED ORDER — MEDROXYPROGESTERONE ACETATE 150 MG/ML IM SUSP
150.0000 mg | Freq: Once | INTRAMUSCULAR | Status: AC
  Administered 2017-01-21: 150 mg via INTRAMUSCULAR

## 2017-02-14 DIAGNOSIS — J441 Chronic obstructive pulmonary disease with (acute) exacerbation: Secondary | ICD-10-CM | POA: Diagnosis not present

## 2017-04-04 ENCOUNTER — Other Ambulatory Visit: Payer: Self-pay | Admitting: Pulmonary Disease

## 2017-04-17 ENCOUNTER — Other Ambulatory Visit: Payer: Self-pay | Admitting: Gynecology

## 2017-04-17 ENCOUNTER — Ambulatory Visit: Payer: Commercial Managed Care - PPO

## 2017-04-18 ENCOUNTER — Telehealth: Payer: Self-pay | Admitting: *Deleted

## 2017-04-18 NOTE — Telephone Encounter (Signed)
Prior Authorization for depo provera done online via cover my meds , response noted PA not needed, patient can fill up to 30 days through retail pharmacy, anything greater pt will need to have filled at [facility] mail order pharmacy.

## 2017-04-22 ENCOUNTER — Ambulatory Visit: Payer: Commercial Managed Care - PPO

## 2017-04-24 ENCOUNTER — Other Ambulatory Visit: Payer: Self-pay | Admitting: Gynecology

## 2017-04-24 ENCOUNTER — Other Ambulatory Visit: Payer: Commercial Managed Care - PPO

## 2017-04-24 ENCOUNTER — Ambulatory Visit (INDEPENDENT_AMBULATORY_CARE_PROVIDER_SITE_OTHER): Payer: Commercial Managed Care - PPO | Admitting: Gynecology

## 2017-04-24 DIAGNOSIS — N912 Amenorrhea, unspecified: Secondary | ICD-10-CM

## 2017-04-24 DIAGNOSIS — Z3042 Encounter for surveillance of injectable contraceptive: Secondary | ICD-10-CM

## 2017-04-24 LAB — PREGNANCY, URINE: PREG TEST UR: NEGATIVE

## 2017-04-24 MED ORDER — MEDROXYPROGESTERONE ACETATE 150 MG/ML IM SUSP
150.0000 mg | Freq: Once | INTRAMUSCULAR | Status: AC
  Administered 2017-04-24: 150 mg via INTRAMUSCULAR

## 2017-06-11 ENCOUNTER — Telehealth: Payer: Self-pay | Admitting: *Deleted

## 2017-06-11 MED ORDER — MEDROXYPROGESTERONE ACETATE 150 MG/ML IM SUSP: 150.0000 mg | Freq: Once | INTRAMUSCULAR | 1 refills | Status: DC

## 2017-06-11 NOTE — Telephone Encounter (Signed)
Pt called requesting refill on depo provera, last shot given on 04/24/17, not due until 9/6-9/19. I explained to patient her next due date for injection was in sept. But I will send Rx for refill until annual in Nov.

## 2017-09-23 ENCOUNTER — Encounter: Payer: Commercial Managed Care - PPO | Admitting: Gynecology

## 2017-09-23 DIAGNOSIS — Z0289 Encounter for other administrative examinations: Secondary | ICD-10-CM

## 2018-06-23 IMAGING — MG 2D DIGITAL DIAGNOSTIC BILATERAL MAMMOGRAM WITH CAD AND ADJUNCT T
7 of 13 series · 7 of 29 positions shown · non-contrast
Comparison: [DATE] [DATE], [DATE];

CLINICAL DATA: 50-year-old patient due for annual examination.
Recently, a new palpable lump was palpated in the lower inner
quadrant of the left breast in the 8-9 o'clock position near the
periphery. She has a history of prior palpable nodule in the 6
o'clock left breast near the inframammary fold, not definitely
palpated on recent physical exam. The patient is asymptomatic on the
right. At the last time she had breast imaging, a probable
fibroadenoma was noted in the 10 o'clock position of the right
breast. The patient has not presented for follow-up until now.

EXAM:
2D DIGITAL DIAGNOSTIC BILATERAL MAMMOGRAM WITH CAD AND ADJUNCT TOMO
ULTRASOUND LEFT BREAST

[L MLO]
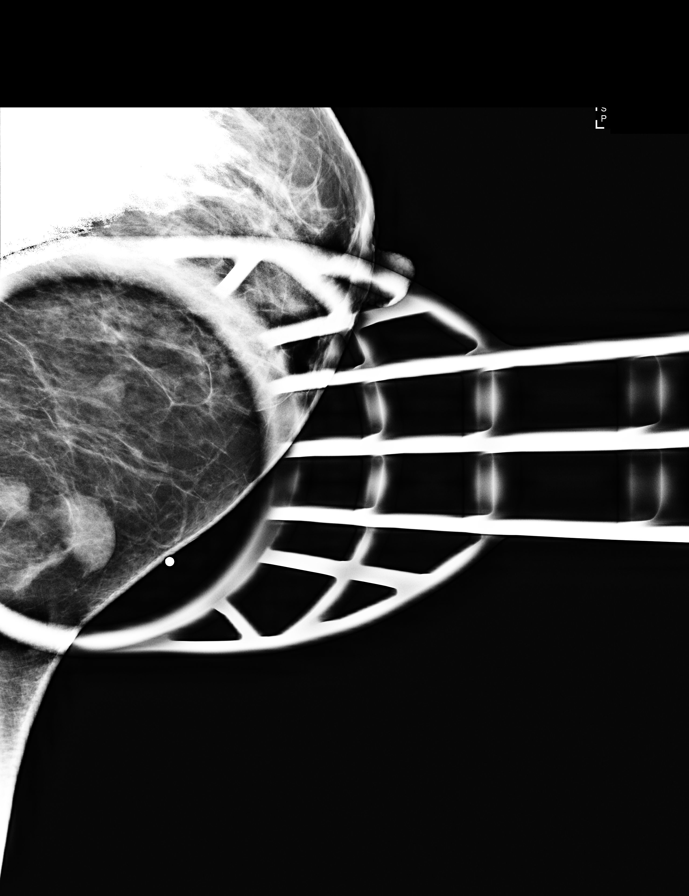

[L CC synth-2D]
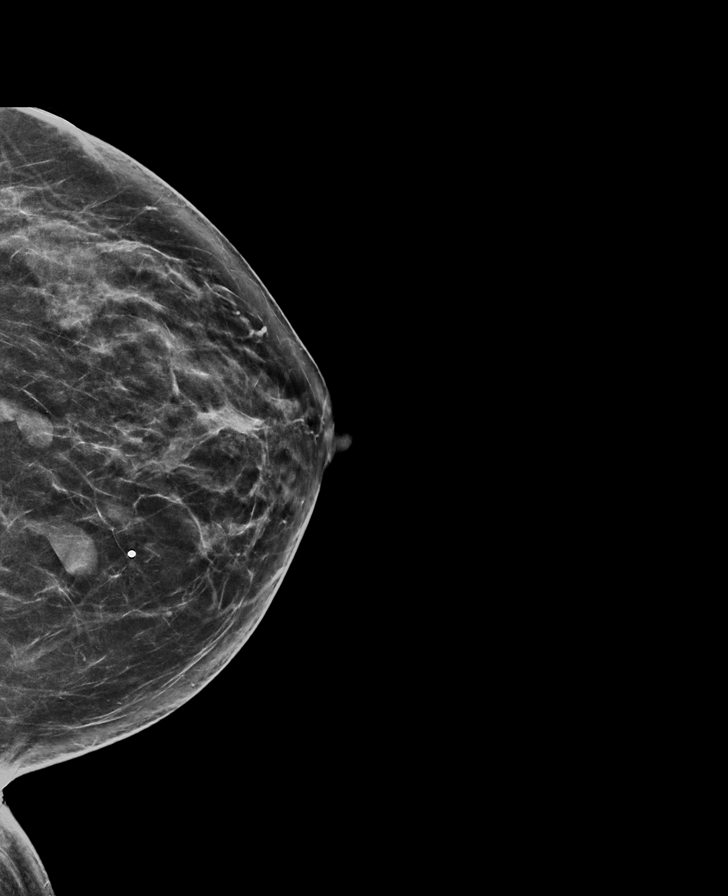

[R MLO synth-2D]
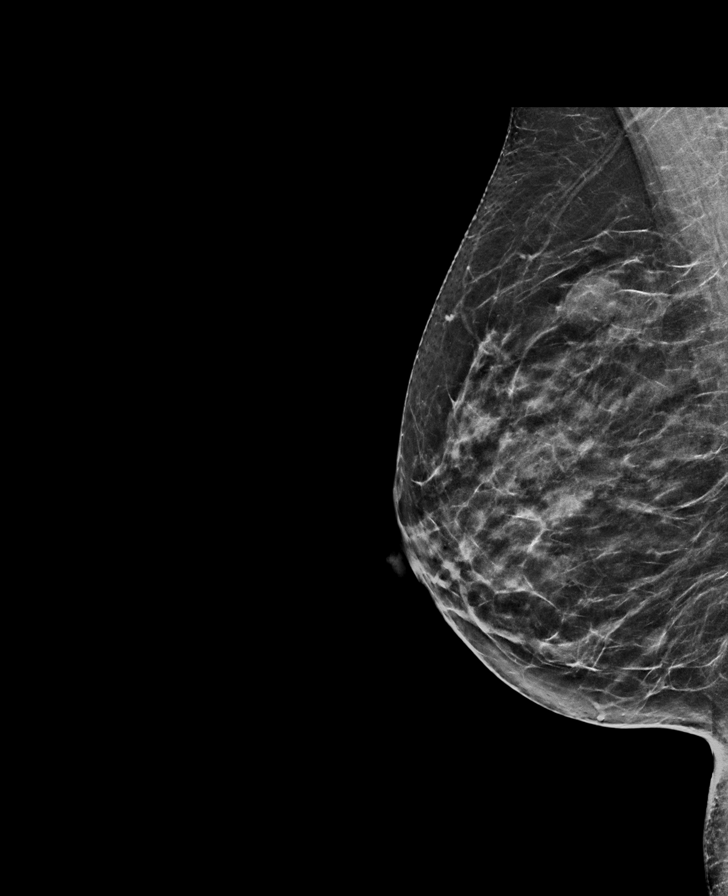

[L CC]
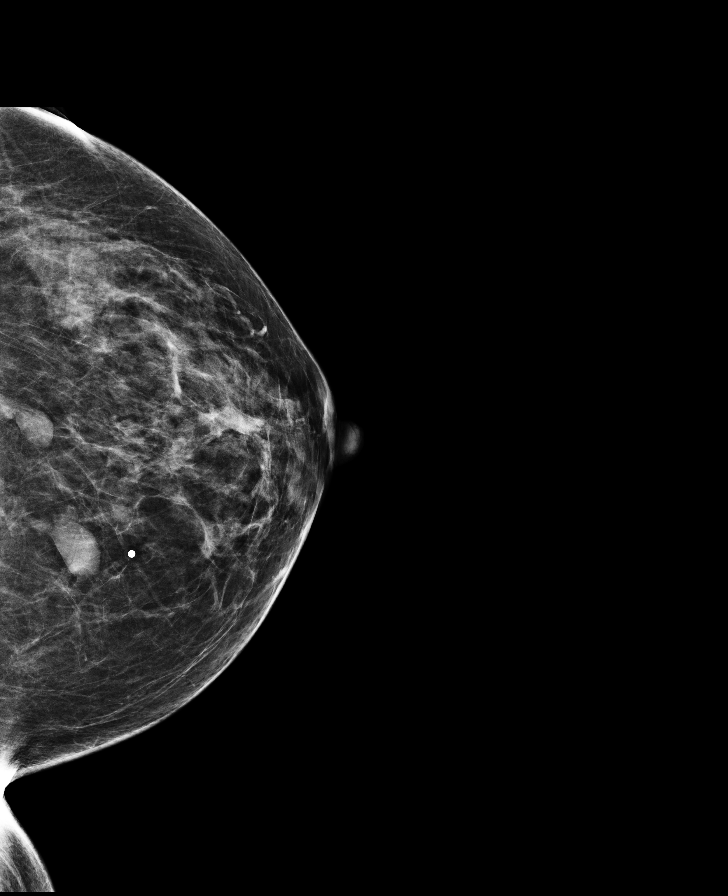

[R MLO]
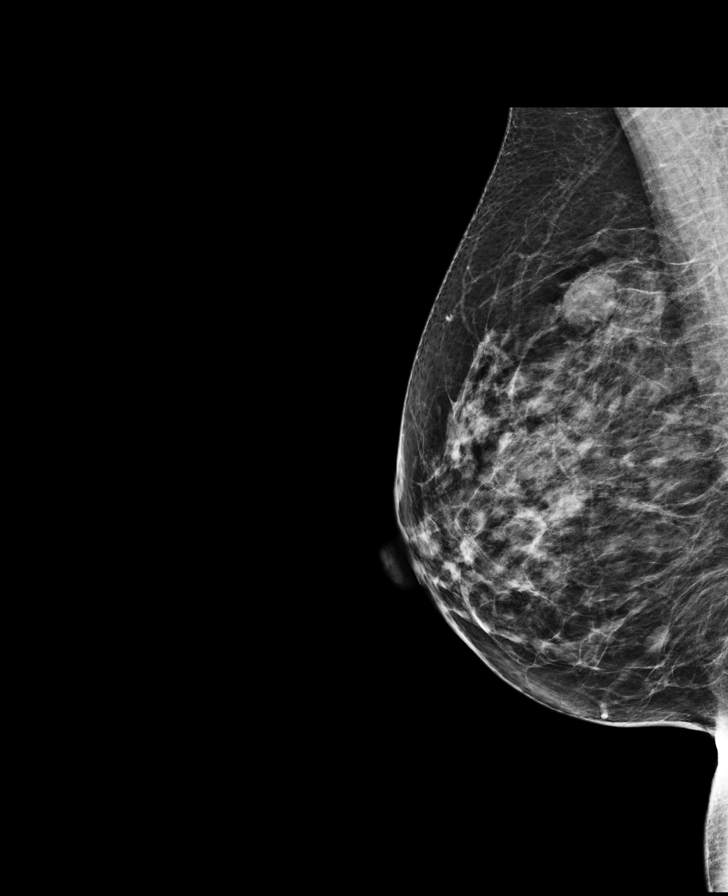

[L MLO synth-2D]
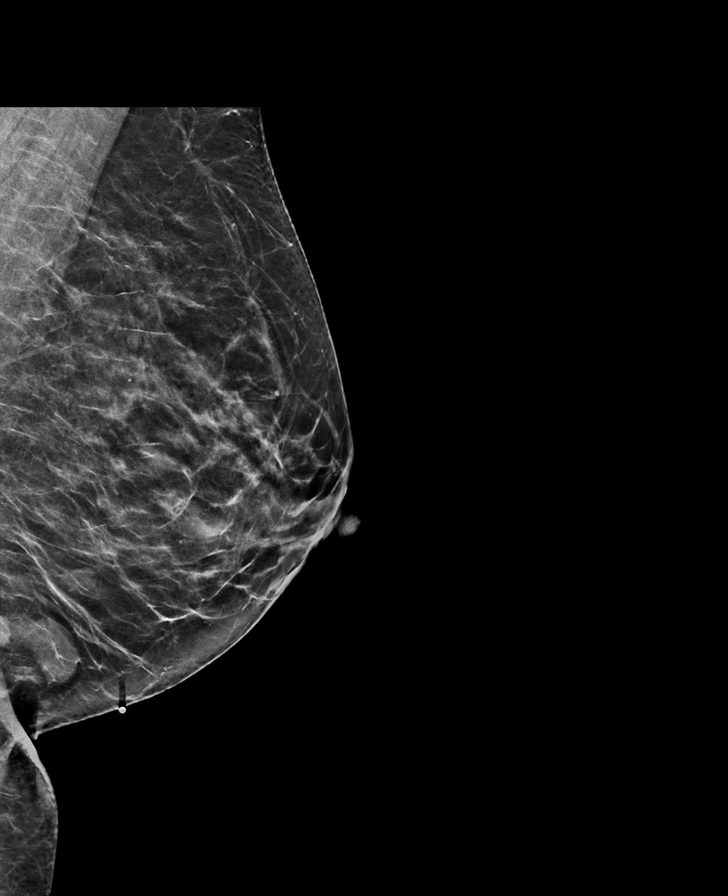

[R CC synth-2D]
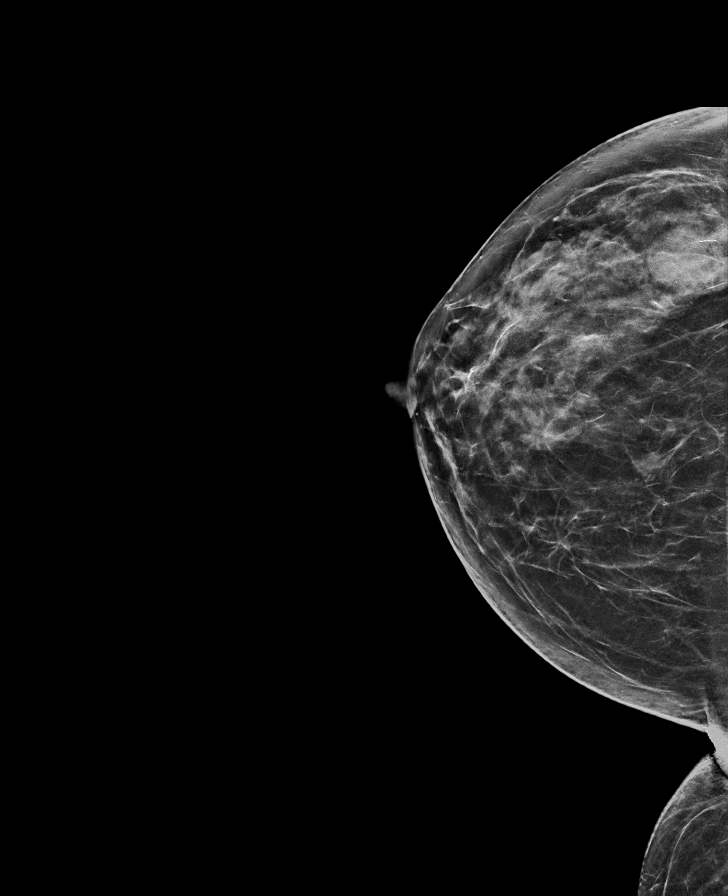

[7 of 29 positions shown; findings below may reference images not displayed]

[DATE] [DATE], [DATE]; [DATE] [DATE],
[DATE]; [DATE] [DATE], [DATE]

ACR Breast Density Category b: There are scattered areas of
fibroglandular density.
FINDINGS: Previously described probable fibroadenoma in the upper outer right
breast posteriorly has decreased in size on the mammogram since
July 2014, consistent with a benign mass. No new or suspicious
mass, distortion, or suspicious microcalcification is identified in
the right breast.

A metallic skin marker was placed in the region of new palpable
concern in the lower inner quadrant of the left breast. Subjacent to
the skin marker is a circumscribed oval mass measuring approximately
1.8 cm greatest diameter on mammography. Additionally, there is a
stable circumscribed mass in the 6 o'clock region of the left breast
near the inframammary fold, previously imaged and felt to be a
probable fibroadenoma. No distortion or suspicious
microcalcification is identified in the left breast.

Mammographic images were processed with CAD.

On physical exam, I do palpate a smooth oval mass in the 7 o'clock
region of the left breast 6 cm from the nipple in the region of
patient concern.

Targeted ultrasound is performed, showing a homogeneously hypoechoic
circumscribed oval mass oriented parallel to the chest wall, gently
lobulated. This mass measures 1.8 x 1.5 x 1.7 and corresponds to the
mass seen in this region on the mammogram.

Again noted is a circumscribed hypoechoic oval mass in the 6 o'clock
left breast near the inframammary fold measuring 1.9 x 0.8 x 1.0 cm.
This mass has been stable for at least 2 years and can be considered
benign.
IMPRESSION: 1. The new palpable solid mass in the 7 o'clock position of the left
breast has imaging features most consistent with a benign
fibroadenoma.
2. Stable fibroadenoma 6 o'clock position left breast. This mass
does not require additional follow-up given its stability for
greater than 2 years.
3. Interval decrease in size of mass in the upper-outer quadrant of
the right breast, previously imaged in 4223 with ultrasound features
compatible with fibroadenoma. Given its decrease in size it can be
considered benign.

RECOMMENDATION:
Left breast ultrasound is recommended in 6 months to evaluate for
stability of the new mass in the 7 o'clock position.

I have discussed the findings and recommendations with the patient.
Results were also provided in writing at the conclusion of the
visit. If applicable, a reminder letter will be sent to the patient
regarding the next appointment.

BI-RADS CATEGORY  3: Probably benign.

## 2018-10-13 ENCOUNTER — Ambulatory Visit: Payer: Self-pay | Admitting: Family Medicine

## 2018-10-13 VITALS — BP 118/78 | HR 97 | Temp 98.5°F | Resp 18 | Wt 153.2 lb

## 2018-10-13 DIAGNOSIS — J329 Chronic sinusitis, unspecified: Secondary | ICD-10-CM

## 2018-10-13 DIAGNOSIS — B009 Herpesviral infection, unspecified: Secondary | ICD-10-CM

## 2018-10-13 DIAGNOSIS — J029 Acute pharyngitis, unspecified: Secondary | ICD-10-CM

## 2018-10-13 DIAGNOSIS — Z8619 Personal history of other infectious and parasitic diseases: Secondary | ICD-10-CM

## 2018-10-13 MED ORDER — PSEUDOEPH-BROMPHEN-DM 30-2-10 MG/5ML PO SYRP: 10.0000 mL | ORAL_SOLUTION | Freq: Three times a day (TID) | ORAL | 0 refills | Status: DC | PRN

## 2018-10-13 MED ORDER — VALACYCLOVIR HCL 1 G PO TABS: 1000.0000 mg | ORAL_TABLET | Freq: Two times a day (BID) | ORAL | 0 refills | Status: AC

## 2018-10-13 NOTE — Patient Instructions (Signed)

## 2018-10-13 NOTE — Progress Notes (Signed)
Sophia Vance is a 52 y.o. female who presents today with concerns of sore throat for the last 2 weeks. She denies any chronic health conditions and is currently not insured. She denies risk for strep, has a cough, no fever and denies exposure to children or known strep dx persons recently. She has not attempted to treat the condition with any medication up to this point.  Review of Systems  Constitutional: Negative for chills, fever and malaise/fatigue.  HENT: Positive for congestion and sore throat. Negative for ear discharge, ear pain and sinus pain.   Eyes: Negative.   Respiratory: Positive for cough. Negative for sputum production and shortness of breath.   Cardiovascular: Negative.  Negative for chest pain.  Gastrointestinal: Negative for abdominal pain, diarrhea, nausea and vomiting.  Genitourinary: Negative for dysuria, frequency, hematuria and urgency.  Musculoskeletal: Negative for myalgias.  Skin: Negative.   Neurological: Negative for headaches.  Endo/Heme/Allergies: Negative.   Psychiatric/Behavioral: Negative.     O: Vitals:   10/13/18 1830  BP: 118/78  Pulse: 97  Resp: 18  Temp: 98.5 F (36.9 C)  SpO2: 98%     Physical Exam  Constitutional: She is oriented to person, place, and time. Vital signs are normal. She appears well-developed and well-nourished. She is active.  Non-toxic appearance. She does not have a sickly appearance.  HENT:  Head: Normocephalic.  Right Ear: Hearing, external ear and ear canal normal. A middle ear effusion is present.  Left Ear: Hearing, tympanic membrane, external ear and ear canal normal.  Nose: Nose normal. Right sinus exhibits no maxillary sinus tenderness and no frontal sinus tenderness. Left sinus exhibits no maxillary sinus tenderness and no frontal sinus tenderness.  Mouth/Throat: Uvula is midline and mucous membranes are normal. Posterior oropharyngeal erythema present. Tonsils are 0 on the right. Tonsils are 0 on the left. No  tonsillar exudate.    Reports tongue soreness  Neck: Normal range of motion. Neck supple.  Cardiovascular: Normal rate, regular rhythm, normal heart sounds and normal pulses.  Pulmonary/Chest: Effort normal and breath sounds normal.  Abdominal: Soft. Bowel sounds are normal.  Musculoskeletal: Normal range of motion.  Lymphadenopathy:       Head (right side): Submental and tonsillar adenopathy present. No submandibular adenopathy present.       Head (left side): Submental and tonsillar adenopathy present. No submandibular adenopathy present.    She has no cervical adenopathy.  Neurological: She is alert and oriented to person, place, and time.  Psychiatric: She has a normal mood and affect.  Vitals reviewed. Patient reports pain in her mouth and that it feels like she has lesions on her tongue.  A: 1. Sore throat   2. History of cold sores   3. Sinusitis, unspecified chronicity, unspecified location   4. Herpes simplex     P: Discussed exam findings, diagnosis etiology and medication use and indications reviewed with patient. Follow- Up and discharge instructions provided. No emergent/urgent issues found on exam.  Patient verbalized understanding of information provided and agrees with plan of care (POC), all questions answered.  1. Sore throat - valACYclovir (VALTREX) 1000 MG tablet; Take 1 tablet (1,000 mg total) by mouth 2 (two) times daily for 7 days.  2. History of cold sores - valACYclovir (VALTREX) 1000 MG tablet; Take 1 tablet (1,000 mg total) by mouth 2 (two) times daily for 7 days.  3. Sinusitis, unspecified chronicity, unspecified location - brompheniramine-pseudoephedrine-DM 30-2-10 MG/5ML syrup; Take 10 mLs by mouth 3 (three) times daily as  needed.  4. Herpes simplex - valACYclovir (VALTREX) 1000 MG tablet; Take 1 tablet (1,000 mg total) by mouth 2 (two) times daily for 7 days.  Discussed likely viral nature of sore throat given presence of multiple lesions on  lips consistent with cold sores and report of gingivostomatitis. Discussed starting with oral antiviral and monitoring condition over the next few days. Will provide a cough suppressant/ decongestant and advised patient to restart oral allergy medication.

## 2018-10-15 ENCOUNTER — Telehealth: Payer: Self-pay

## 2018-10-15 DIAGNOSIS — J029 Acute pharyngitis, unspecified: Secondary | ICD-10-CM

## 2018-10-15 MED ORDER — AMOXICILLIN 875 MG PO TABS: 875.0000 mg | ORAL_TABLET | Freq: Two times a day (BID) | ORAL | 0 refills | Status: AC

## 2018-10-15 NOTE — Telephone Encounter (Signed)
Patient states only mild improvement in symptoms and that now both sides of her throat hurt now an dis requesting an antibiotic due to length of symptoms greater than 2 weeks despite negative strep results will treat for bacterial pharyngitis. Meds ordered this encounter  Medications  . amoxicillin (AMOXIL) 875 MG tablet    Sig: Take 1 tablet (875 mg total) by mouth 2 (two) times daily for 7 days.    Dispense:  14 tablet    Refill:  0    Order Specific Question:   Supervising Provider    Answer:   Noemi Chapel [[id number]]

## 2018-10-15 NOTE — Telephone Encounter (Signed)
Patient states she is ok, she is sneezing and she has sore throat and feels her throat is swollen, she wanted to know if she can be call for antibiotic.

## 2018-10-16 ENCOUNTER — Other Ambulatory Visit: Payer: Self-pay | Admitting: Family Medicine

## 2019-01-16 ENCOUNTER — Encounter (HOSPITAL_COMMUNITY): Payer: Self-pay | Admitting: Emergency Medicine

## 2019-01-16 ENCOUNTER — Emergency Department (HOSPITAL_COMMUNITY): Payer: Commercial Managed Care - PPO

## 2019-01-16 ENCOUNTER — Other Ambulatory Visit: Payer: Self-pay

## 2019-01-16 ENCOUNTER — Emergency Department (HOSPITAL_COMMUNITY)
Admission: EM | Admit: 2019-01-16 | Discharge: 2019-01-16 | Disposition: A | Discharge status: Home / Self Care | Payer: Commercial Managed Care - PPO | Attending: Emergency Medicine | Admitting: Emergency Medicine

## 2019-01-16 DIAGNOSIS — R079 Chest pain, unspecified: Secondary | ICD-10-CM | POA: Diagnosis not present

## 2019-01-16 DIAGNOSIS — F17210 Nicotine dependence, cigarettes, uncomplicated: Secondary | ICD-10-CM | POA: Insufficient documentation

## 2019-01-16 DIAGNOSIS — R0600 Dyspnea, unspecified: Secondary | ICD-10-CM | POA: Diagnosis not present

## 2019-01-16 DIAGNOSIS — R0789 Other chest pain: Secondary | ICD-10-CM | POA: Diagnosis not present

## 2019-01-16 DIAGNOSIS — Z5320 Procedure and treatment not carried out because of patient's decision for unspecified reasons: Secondary | ICD-10-CM | POA: Insufficient documentation

## 2019-01-16 DIAGNOSIS — Z7982 Long term (current) use of aspirin: Secondary | ICD-10-CM | POA: Insufficient documentation

## 2019-01-16 DIAGNOSIS — J449 Chronic obstructive pulmonary disease, unspecified: Secondary | ICD-10-CM | POA: Diagnosis not present

## 2019-01-16 LAB — I-STAT BETA HCG BLOOD, ED (MC, WL, AP ONLY): I-stat hCG, quantitative: <5 m[IU]/mL (ref <5)

## 2019-01-16 LAB — BASIC METABOLIC PANEL
Anion gap: 11 (ref 5–15)
BUN: 7 mg/dL (ref 6–20)
CO2: 23 mmol/L (ref 22–32)
Calcium: 8.7 mg/dL — ABNORMAL LOW (ref 8.9–10.3)
Chloride: 105 mmol/L (ref 98–111)
Creatinine, Ser: 0.92 mg/dL (ref 0.44–1.00)
GFR calc Af Amer: >60 mL/min (ref >60)
GFR calc non Af Amer: >60 mL/min (ref >60)
Glucose, Bld: 92 mg/dL (ref 70–99)
Potassium: 3.6 mmol/L (ref 3.5–5.1)
Sodium: 139 mmol/L (ref 135–145)

## 2019-01-16 LAB — I-STAT TROPONIN, ED
Troponin i, poc: 0 ng/mL (ref 0.00–0.08)
Troponin i, poc: 0 ng/mL (ref 0.00–0.08)

## 2019-01-16 LAB — CBC
HCT: 38.9 % (ref 36.0–46.0)
Hemoglobin: 13.4 g/dL (ref 12.0–15.0)
MCH: 32.5 pg (ref 26.0–34.0)
MCHC: 34.4 g/dL (ref 30.0–36.0)
MCV: 94.4 fL (ref 80.0–100.0)
Platelets: 273 10*3/uL (ref 150–400)
RBC: 4.12 MIL/uL (ref 3.87–5.11)
RDW: 12.3 % (ref 11.5–15.5)
WBC: 9.4 10*3/uL (ref 4.0–10.5)
nRBC: 0 % (ref 0.0–0.2)

## 2019-01-16 MED ORDER — SODIUM CHLORIDE 0.9% FLUSH: 3.0000 mL | Freq: Once | INTRAVENOUS | Status: DC

## 2019-01-16 MED ORDER — NITROGLYCERIN 0.4 MG SL SUBL: 0.4000 mg | SUBLINGUAL_TABLET | SUBLINGUAL | Status: DC | PRN

## 2019-01-16 MED ORDER — ASPIRIN 81 MG PO CHEW
324.0000 mg | CHEWABLE_TABLET | Freq: Once | ORAL | Status: AC
  Administered 2019-01-16: 324 mg via ORAL
  Filled 2019-01-16: qty 4

## 2019-01-16 NOTE — ED Provider Notes (Addendum)
[facility] EMERGENCY DEPARTMENT Provider Note   CSN: [id number] Arrival date & time: 01/16/19  1531    History   Chief Complaint Chief Complaint  Patient presents with  . Chest Pain    HPI Sophia Vance is a 53 y.o. female.  HPI: A 53 year old patient presents for evaluation of chest pain. Initial onset of pain was approximately 3-6 hours ago. The patient's chest pain is described as heaviness/pressure/tightness and is not worse with exertion. The patient reports some diaphoresis. The patient's chest pain is middle- or left-sided, is not well-localized, is not sharp and does radiate to the arms/jaw/neck. The patient does not complain of nausea. The patient has smoked in the past 90 days and has a family history of coronary artery disease in a first-degree relative with onset less than age 13. The patient has no history of stroke, has no history of peripheral artery disease, denies any history of treated diabetes, is not hypertensive, has no history of hypercholesterolemia and does not have an elevated BMI (>=30).   HPI   53yo female with history of COPD presenting with chest pain. Chest pressure began at 230PM, still there, feels like a weight on her chest. Was more severe for a few minutes. Associated dyspnea, diaphoresis, mild associated right sided head pressure. No association with activity or rest.  She lifts boxes for work but does not feel that lifting boxes specifically caused the pain.  No current shortness of breath.  No recent travel, not on OCPs, no lower extremity swelling, no known hx of DVT/PE.  Denies fever, chills.  Has chronic nonproductive cough with COPD>   Mom had CABG at 37 thinks stents before that in 39s, uncle, other extended family with CAD in 2s   Past Medical History:  Diagnosis Date  . Anxiety   . COPD [facility]ch[facility] pulmonary disease[facility])     Patient Active Problem List   Diagnosis Date Noted  . Sinusitis 12/28/2010  . CHRONIC  OBSTRUCTIVE PULMONARY DISEASE 09/21/2010  . TOBACCO ABUSE 09/07/2010  . DEPRESSION 09/07/2010  . Dyspnea on exertion 09/07/2010  . CHEST PAIN, ATYPICAL, HX OF 09/07/2010    Past Surgical History:  Procedure Laterality Date  . APPENDECTOMY    . BREAST SURGERY     Biopsy neg.     OB History    Gravida  1   Para  1   Term      Preterm      AB      Living  1     SAB      TAB      Ectopic      Multiple      Live Births               Home Medications    Prior to Admission medications   Medication Sig Start Date End Date Taking? Authorizing Provider  aspirin EC 81 MG tablet Take 81 mg by mouth daily.   Yes [provider]  diphenhydrAMINE (BENADRYL) 25 mg capsule Take 25 mg by mouth daily. for allergies   Yes [provider]  FLUoxetine (PROZAC) 20 MG tablet Take 1 tablet (20 mg total) by mouth daily. 09/21/16  Yes Fontaine, Belinda Block, MD  ibuprofen (ADVIL,MOTRIN) 200 MG tablet Take 800 mg by mouth every 6 (six) hours as needed for headache (pain).   Yes [provider]  brompheniramine-pseudoephedrine-DM 30-2-10 MG/5ML syrup Take 10 mLs by mouth 3 (three) times daily as needed. Patient  not taking: Reported on 01/16/2019 10/13/18   Shella Maxim, NP  ibuprofen (ADVIL,MOTRIN) 600 MG tablet Take 1 tablet (600 mg total) by mouth every 8 (eight) hours as needed. Patient not taking: Reported on 10/13/2018 10/05/14   Huel Cote, NP  medroxyPROGESTERone (DEPO-PROVERA) 150 MG/ML injection Inject 1 mL (150 mg total) into the muscle once. Patient not taking: Reported on 01/16/2019 06/11/17 06/11/17  Fontaine, Belinda Block, MD  STIOLTO RESPIMAT 2.5-2.5 MCG/ACT AERS INHALE 2 PUFFS INTO THE LUNGS DAILY Patient not taking: Reported on 10/13/2018 04/04/17   Marshell Garfinkel, MD  Tiotropium Bromide Monohydrate (SPIRIVA RESPIMAT) 1.25 MCG/ACT AERS Inhale 2 puffs into the lungs daily. Patient not taking: Reported on 10/13/2018 11/25/15   Marshell Garfinkel, MD     Family History Family History  Problem Relation Age of Onset  . Osteoarthritis Mother   . Heart failure Mother   . Hypertension Mother   . COPD Mother   . Cancer Father        Throat  . Heart attack Maternal Grandfather     Social History Social History   Tobacco Use  . Smoking status: Current Every Day Smoker    Packs/day: 0.25    Years: 35.00    Pack years: 8.75    Types: Cigarettes  . Smokeless tobacco: Never Used  . Tobacco comment: 3 cigs per day (4.24.17)  Substance Use Topics  . Alcohol use: Yes    Alcohol/week: 0.0 standard drinks    Comment: Rare  . Drug use: No     Allergies   Patient has no known allergies.   Review of Systems Review of Systems  Constitutional: Positive for diaphoresis. Negative for fever.  HENT: Negative for sore throat.   Eyes: Negative for visual disturbance.  Respiratory: Positive for shortness of breath. Negative for cough.   Cardiovascular: Positive for chest pain.  Gastrointestinal: Negative for abdominal pain.  Genitourinary: Negative for difficulty urinating.  Musculoskeletal: Negative for back pain and neck pain.  Skin: Negative for rash.  Neurological: Negative for syncope and headaches.     Physical Exam Updated Vital Signs BP 115/64 (BP Location: Left Arm)   Pulse 72   Temp 97.7 F (36.5 C) (Oral)   Resp 18   Ht 5\' 4"  (1.626 m)   Wt 68 kg   SpO2 97%   BMI 25.75 kg/m   Physical Exam Vitals signs and nursing note reviewed.  Constitutional:      General: She is not in acute distress.    Appearance: She is well-developed. She is not diaphoretic.  HENT:     Head: Normocephalic and atraumatic.  Eyes:     Conjunctiva/sclera: Conjunctivae normal.  Neck:     Musculoskeletal: Normal range of motion.  Cardiovascular:     Rate and Rhythm: Normal rate and regular rhythm.     Heart sounds: Normal heart sounds. No murmur. No friction rub. No gallop.   Pulmonary:     Effort: Pulmonary effort is normal. No  respiratory distress.     Breath sounds: Normal breath sounds. No wheezing or rales.  Abdominal:     General: There is no distension.     Palpations: Abdomen is soft.     Tenderness: There is no abdominal tenderness. There is no guarding.  Musculoskeletal:        General: No tenderness.  Skin:    General: Skin is warm and dry.     Findings: No erythema or rash.  Neurological:     Mental  Status: She is alert and oriented to person, place, and time.      ED Treatments / Results  Labs (all labs ordered are listed, but only abnormal results are displayed) Labs Reviewed  BASIC METABOLIC PANEL - Abnormal; Notable for the following components:      Result Value   Calcium 8.7 (*)    All other components within normal limits  CBC  I-STAT TROPONIN, ED  I-STAT BETA HCG BLOOD, ED ([facility], [facility], [facility] ONLY)  I-STAT TROPONIN, ED    EKG EKG Interpretation  Date/Time:  Friday January 16 2019 14:40:38 EDT Ventricular Rate:  88 PR Interval:  138 QRS Duration: 82 QT Interval:  366 QTC Calculation: 442 R Axis:   10 Text Interpretation:  Normal sinus rhythm Nonspecific ST abnormality Abnormal ECG No significant change since last tracing Confirmed by Gareth Morgan [phone]) on 01/16/2019 5:05:58 PM   Radiology Dg Chest 2 View  Result Date: 01/16/2019 CLINICAL DATA:  Chest pain. EXAM: CHEST - 2 VIEW COMPARISON:  Radiographs of December 21, 2015. FINDINGS: The heart size and mediastinal contours are within normal limits. Both lungs are clear. No pneumothorax or pleural effusion is noted. The visualized skeletal structures are unremarkable. IMPRESSION: No active cardiopulmonary disease. Electronically Signed   By: Marijo Conception, M.D.   On: 01/16/2019 16:15    Procedures Procedures (including critical care time)  Medications Ordered in ED Medications  aspirin chewable tablet 324 mg (324 mg Oral Given 01/16/19 1845)     Initial Impression / Assessment and Plan / ED Course  I have reviewed the  triage vital signs and the nursing notes.  Pertinent labs & imaging results that were available during my care of the patient were reviewed by me and considered in my medical decision making (see chart for details).     HEAR Score: 5  53yo female with history of COPD presenting with chest pain.  Differential diagnosis for chest pain includes pulmonary embolus, dissection, pneumothorax, pneumonia, ACS, myocarditis, pericarditis.  EKG was done and evaluate by me and showed no acute ST changes and no signs of pericarditis. Chest x-ray was done and evaluated by me and radiology and showed no sign of pneumonia or pneumothorax. Patient is PERC negative and low risk [place] and have low suspicion for PE.  History and exam not consistent with dissection.  Initial troponin negative. She is high risk HEAR score by history, as well as history of smoking and family hx of CAD.  Discussed recommendation for admission for chest pain work up given high risk by HEART score, however she would like to leave against medical advice due to childcare issues.  Discussed she has high risk chest pain, and she understands the risk of MI, rhythm problems, heart failure, disability and death.  Second troponin negative.  She is chest pain free.  Recommend close Cardiology follow up or return to the ED if she changes her mind regarding admission for CP rule out or if she has other concerning symptoms.  She left AMA in stable condition.  Final Clinical Impressions(s) / ED Diagnoses   Final diagnoses:  Chest pain, unspecified type    ED Discharge Orders    None       Gareth Morgan, MD 01/17/19 9675    Gareth Morgan, MD 03/10/19 1006

## 2019-01-16 NOTE — ED Notes (Signed)
Patient verbalizes understanding of discharge instructions. Opportunity for questioning and answers were provided. Armband removed by staff, pt discharged from ED.  

## 2019-01-16 NOTE — ED Triage Notes (Signed)
Pt to ED with c/o mid chest pain onset earlier today.  St's pain has subsided at this time but continues to fill heavy.  Also c/o shortness of breath

## 2019-03-04 ENCOUNTER — Telehealth: Payer: Self-pay

## 2019-03-04 NOTE — Telephone Encounter (Signed)
   Cardiac Questionnaire:    Since your last visit or hospitalization:    1. Have you been having new or worsening chest pain? Still some chest pain at times   2. Have you been having new or worsening shortness of breath? Yes with activity 3. Have you been having new or worsening leg swelling, wt gain, or increase in abdominal girth (pants fitting more tightly)?No    4. Have you had any passing out spells? NO    *A YES to any of these questions would result in the appointment being kept. *If all the answers to these questions are NO, we should indicate that given the current situation regarding the worldwide coronarvirus pandemic, at the recommendation of the CDC, we are looking to limit gatherings in our waiting area, and thus will reschedule their appointment beyond four weeks from today.   _____________   [id number] Pre-Screening Questions:  . Do you currently have a fever? No . Have you recently travelled on a cruise, internationally, or to [place], [place], [place], [place], [place], or [place], [place] [employer]) ? No  . Have you been in contact with someone that is currently pending confirmation of Covid19 testing or has been confirmed to have the [place] virus?   NO . Are you currently experiencing fatigue or cough? YES - Patient stated she has COPD and this is nothing new.      Screening done, patient has appointment with Dr. Johnsie Cancel on 03/09/19.

## 2019-03-07 NOTE — Progress Notes (Signed)
Cardiology Consultation:   Patient ID: Sophia Vance MRN: [id number]; DOB: 1966/05/29  Admit date: (Not on file) Date of Consult: 03/09/2019  Primary Care Provider: Patient, No Pcp Per Primary Cardiologist: New /   Primary Electrophysiologist:  None     Patient Profile:   Sophia Vance is a 53 y.o. female with a hx of COPD  who is being seen today for the evaluation of chest pain at the request of Sophia Vance .  History of Present Illness:   Sophia Vance 53 y.o. with COPD. Seen in  01/16/19 for chest pain. Chest pressure began in afternoon persisted in ER , feels like a weight on her chest. Was more severe for a few minutes. Associated dyspnea, diaphoresis, mild associated right sided head pressure. No association with activity or rest.  She lifts boxes for work but does not feel that lifting boxes specifically caused the pain.  No current shortness of breath.  No recent travel, not on OCPs, no lower extremity swelling, no known hx of DVT/PE.  Denies fever, chills.  Has chronic nonproductive cough with COPD>   Mom had CABG at 17 thinks stents before that in 74s, uncle, other extended family with CAD in 37s  CXR NAD, no acute ECG changes and troponin negative x 2 Advised admission but left due to child care issues pain very atypical. "Constant" since March. Not exertional Heaviness in chest  Husband and 73 yo daughter at home Smoking 10 cigs/day. Needs f/u with Sophia Vance last seen 2017  Past Medical History:  Diagnosis Date  . Anxiety   . Chest pain   . COPD (chronic obstructive pulmonary disease) ([place])     Past Surgical History:  Procedure Laterality Date  . APPENDECTOMY    . BREAST SURGERY     Biopsy neg.     Home Medications:  Prior to Admission medications   Medication Sig Start Date End Date Taking? Authorizing Provider  aspirin EC 81 MG tablet Take 81 mg by mouth daily.    [provider]  brompheniramine-pseudoephedrine-DM 30-2-10 MG/5ML syrup  Take 10 mLs by mouth 3 (three) times daily as needed. Patient not taking: Reported on 01/16/2019 10/13/18   Sophia Maxim, NP  diphenhydrAMINE (BENADRYL) 25 mg capsule Take 25 mg by mouth daily. for allergies    [provider]  FLUoxetine (PROZAC) 20 MG tablet Take 1 tablet (20 mg total) by mouth daily. 09/21/16   Fontaine, Belinda Block, MD  ibuprofen (ADVIL,MOTRIN) 200 MG tablet Take 800 mg by mouth every 6 (six) hours as needed for headache (pain).    [provider]  ibuprofen (ADVIL,MOTRIN) 600 MG tablet Take 1 tablet (600 mg total) by mouth every 8 (eight) hours as needed. Patient not taking: Reported on 10/13/2018 10/05/14   Sophia Cote, NP  medroxyPROGESTERone (DEPO-PROVERA) 150 MG/ML injection Inject 1 mL (150 mg total) into the muscle once. Patient not taking: Reported on 01/16/2019 06/11/17 06/11/17  Fontaine, Belinda Block, MD  STIOLTO RESPIMAT 2.5-2.5 MCG/ACT AERS INHALE 2 PUFFS INTO THE LUNGS DAILY Patient not taking: Reported on 10/13/2018 04/04/17   Sophia Garfinkel, MD  Tiotropium Bromide Monohydrate (SPIRIVA RESPIMAT) 1.25 MCG/ACT AERS Inhale 2 puffs into the lungs daily. Patient not taking: Reported on 10/13/2018 11/25/15   Sophia Garfinkel, MD    Inpatient Medications: Scheduled Meds:  Continuous Infusions:  PRN Meds:   Allergies:   No Known Allergies  Social History:   Social History   Socioeconomic History  . Marital status:  Married    Spouse name: Not on file  . Number of children: Not on file  . Years of education: Not on file  . Highest education level: Not on file  Occupational History  . Not on file  Social Needs  . Financial resource strain: Not on file  . Food insecurity:    Worry: Not on file    Inability: Not on file  . Transportation needs:    Medical: Not on file    Non-medical: Not on file  Tobacco Use  . Smoking status: Current Every Day Smoker    Packs/day: 0.25    Years: 35.00    Pack years: 8.75    Types: Cigarettes  . Smokeless  tobacco: Never Used  . Tobacco comment: 3 cigs per day (4.24.17)  Substance and Sexual Activity  . Alcohol use: Yes    Alcohol/week: 0.0 standard drinks    Comment: Rare  . Drug use: No  . Sexual activity: Yes    Birth control/protection: None    Comment: 1st intercourse 53 yo-5 partners  Lifestyle  . Physical activity:    Days per week: Not on file    Minutes per session: Not on file  . Stress: Not on file  Relationships  . Social connections:    Talks on phone: Not on file    Gets together: Not on file    Attends religious service: Not on file    Active member of club or organization: Not on file    Attends meetings of clubs or organizations: Not on file    Relationship status: Not on file  . Intimate partner violence:    Fear of current or ex partner: Not on file    Emotionally abused: Not on file    Physically abused: Not on file    Forced sexual activity: Not on file  Other Topics Concern  . Not on file  Social History Narrative   Married   1 child - daughter   Lives with spouse   Customer Service    Family History:    Family History  Problem Relation Age of Onset  . Osteoarthritis Mother   . Heart failure Mother   . Hypertension Mother   . COPD Mother   . Cancer Father        Throat  . Heart attack Maternal Grandfather      ROS:  Please see the history of present illness.   All other ROS reviewed and negative.     Physical Exam/Data:   Vitals:   03/09/19 0855  BP: 110/74  Pulse: 90  SpO2: 98%  Weight: 70.2 kg  Height: 5\' 4"  (1.626 m)   @IOBRIEF @ Last 3 Weights 03/09/2019 01/16/2019 10/13/2018  Weight (lbs) 154 lb 12.8 oz 150 lb 153 lb 3.2 oz  Weight (kg) 70.217 kg 68.04 kg 69.491 kg     Body mass index is 26.57 kg/m.  General:  Well nourished, well developed, in no acute distress  HEENT: normal Lymph: no adenopathy Neck: no JVD Endocrine:  No thryomegaly Vascular: No carotid bruits; FA pulses 2+ bilaterally without bruits  Cardiac:   normal S1, S2; RRR; no murmur   Lungs:  Mild basilar , rhonchi   Abd: soft, nontender, no hepatomegaly  Ext: no edema Musculoskeletal:  No deformities, BUE and BLE strength normal and equal Skin: warm and dry  Neuro:  CNs 2-12 intact, no focal abnormalities noted Psych:  Normal affect   EKG:   01/17/19 NSR rate  88 nonspecific ST changes  Telemetry:  Telemetry was personally reviewed and demonstrates:  NSR in ER   Relevant CV Studies:  See above HPI ECG and troponin   Laboratory Data:  ChemistryNo results for input(s): NA, K, CL, CO2, GLUCOSE, BUN, CREATININE, CALCIUM, GFRNONAA, GFRAA, ANIONGAP in the last 168 hours.  No results for input(s): PROT, ALBUMIN, AST, ALT, ALKPHOS, BILITOT in the last 168 hours. HematologyNo results for input(s): WBC, RBC, HGB, HCT, MCV, MCH, MCHC, RDW, PLT in the last 168 hours. Cardiac EnzymesNo results for input(s): TROPONINI in the last 168 hours. No results for input(s): TROPIPOC in the last 168 hours.  BNPNo results for input(s): BNP, PROBNP in the last 168 hours.  DDimer No results for input(s): DDIMER in the last 168 hours.  Radiology/Studies:  No results found.  Assessment and Plan:   1. Chest Pain:  Heart score 5 with smoking and family history Nonspecific ST changes on ECG favor lexiscan myovue as easiest test to arrange under covid restrictions SL nitro given  2. COPD:  CXR NAD f/u primary advised smoking cessation Continue Stioilto and Spiriva 3. Anxiety/Depression stable on Prozac f/u primary    F/U PRN if myovue normal  Lexiscan Myovue for chest pain  F/U Mannam for COPD and refills on inhalers    For questions or updates, please contact [facility] Please consult [email or web address] for contact info under     Signed, Jenkins Rouge, MD  03/09/2019 9:22 AM

## 2019-03-09 ENCOUNTER — Encounter (INDEPENDENT_AMBULATORY_CARE_PROVIDER_SITE_OTHER): Payer: Self-pay

## 2019-03-09 ENCOUNTER — Ambulatory Visit (INDEPENDENT_AMBULATORY_CARE_PROVIDER_SITE_OTHER): Payer: Commercial Managed Care - PPO | Admitting: Cardiovascular Disease

## 2019-03-09 ENCOUNTER — Encounter: Payer: Self-pay | Admitting: Cardiovascular Disease

## 2019-03-09 ENCOUNTER — Other Ambulatory Visit: Payer: Self-pay

## 2019-03-09 ENCOUNTER — Encounter

## 2019-03-09 ENCOUNTER — Telehealth (HOSPITAL_COMMUNITY): Payer: Self-pay | Admitting: *Deleted

## 2019-03-09 VITALS — BP 110/74 | HR 90 | Ht 64.0 in | Wt 154.8 lb

## 2019-03-09 DIAGNOSIS — J449 Chronic obstructive pulmonary disease, unspecified: Secondary | ICD-10-CM | POA: Diagnosis not present

## 2019-03-09 DIAGNOSIS — R079 Chest pain, unspecified: Secondary | ICD-10-CM

## 2019-03-09 DIAGNOSIS — F17200 Nicotine dependence, unspecified, uncomplicated: Secondary | ICD-10-CM | POA: Diagnosis not present

## 2019-03-09 NOTE — Patient Instructions (Addendum)
Medication Instructions:   If you need a refill on your cardiac medications before your next appointment, please call your pharmacy.   Lab work: If you have labs (blood work) drawn today and your tests are completely normal, you will receive your results only by: Marland Kitchen MyChart Message (if you have MyChart) OR . A paper copy in the mail If you have any lab test that is abnormal or we need to change your treatment, we will call you to review the results.  Testing/Procedures: Your physician has requested that you have a lexiscan myoview this week. For further information please visit [email or web address]. Please follow instruction sheet, as given.  Follow-Up: At [facility], you and your health needs are our priority.  As part of our continuing mission to provide you with exceptional heart care, we have created designated Provider Care Teams.  These Care Teams include your primary Cardiologist (physician) and Advanced Practice Providers (APPs -  Physician Assistants and Nurse Practitioners) who all work together to provide you with the care you need, when you need it. You will need a follow up appointment as needed with Dr. Johnsie Cancel.  You have been referred to Pulmonology.

## 2019-03-23 ENCOUNTER — Telehealth (HOSPITAL_COMMUNITY): Payer: Self-pay | Admitting: *Deleted

## 2019-03-23 NOTE — Telephone Encounter (Signed)
Patient given detailed instructions per Myocardial Perfusion Study Information Sheet for the test on 03/27/19. Patient notified to arrive 15 minutes early and that it is imperative to arrive on time for appointment to keep from having the test rescheduled.  If you need to cancel or reschedule your appointment, please call the office within 24 hours of your appointment. . Patient verbalized understanding. Kirstie Peri

## 2019-03-26 ENCOUNTER — Telehealth (HOSPITAL_COMMUNITY): Payer: Self-pay

## 2019-03-26 NOTE — Telephone Encounter (Signed)
Encounter complete. 

## 2019-03-27 ENCOUNTER — Ambulatory Visit (HOSPITAL_COMMUNITY)
Admission: RE | Admit: 2019-03-27 | Discharge: 2019-03-27 | Disposition: A | Discharge status: Home / Self Care | Payer: Commercial Managed Care - PPO | Source: Ambulatory Visit | Attending: Cardiovascular Disease | Admitting: Cardiovascular Disease

## 2019-03-27 ENCOUNTER — Other Ambulatory Visit: Payer: Self-pay

## 2019-03-27 DIAGNOSIS — R079 Chest pain, unspecified: Secondary | ICD-10-CM | POA: Diagnosis not present

## 2019-03-27 LAB — MYOCARDIAL PERFUSION IMAGING
LV dias vol: 84 mL (ref 46–106)
LV sys vol: 30 mL
Peak HR: 146 {beats}/min
Rest HR: 62 {beats}/min
SDS: 1
SRS: 0
SSS: 1
TID: 1.1

## 2019-03-27 MED ORDER — TECHNETIUM TC 99M TETROFOSMIN IV KIT
26.9000 | PACK | Freq: Once | INTRAVENOUS | Status: AC | PRN
  Administered 2019-03-27: 26.9 via INTRAVENOUS
  Filled 2019-03-27: qty 27

## 2019-03-27 MED ORDER — TECHNETIUM TC 99M TETROFOSMIN IV KIT
8.8000 | PACK | Freq: Once | INTRAVENOUS | Status: AC | PRN
  Administered 2019-03-27: 8.8 via INTRAVENOUS
  Filled 2019-03-27: qty 9

## 2019-03-27 MED ORDER — REGADENOSON 0.4 MG/5ML IV SOLN
0.4000 mg | Freq: Once | INTRAVENOUS | Status: AC
  Administered 2019-03-27: 0.4 mg via INTRAVENOUS

## 2019-03-27 MED ORDER — AMINOPHYLLINE 25 MG/ML IV SOLN
75.0000 mg | Freq: Once | INTRAVENOUS | Status: AC
  Administered 2019-03-27: 75 mg via INTRAVENOUS

## 2019-05-06 DIAGNOSIS — U071 COVID-19: Secondary | ICD-10-CM

## 2019-05-06 HISTORY — DX: COVID-19: U07.1

## 2019-05-13 ENCOUNTER — Other Ambulatory Visit: Payer: Self-pay

## 2019-05-14 ENCOUNTER — Encounter: Payer: Self-pay | Admitting: Gynecology

## 2019-05-14 ENCOUNTER — Ambulatory Visit (INDEPENDENT_AMBULATORY_CARE_PROVIDER_SITE_OTHER): Payer: Commercial Managed Care - PPO | Admitting: Gynecology

## 2019-05-14 VITALS — BP 130/86 | Ht 65.0 in | Wt 156.0 lb

## 2019-05-14 DIAGNOSIS — N951 Menopausal and female climacteric states: Secondary | ICD-10-CM

## 2019-05-14 DIAGNOSIS — Z01419 Encounter for gynecological examination (general) (routine) without abnormal findings: Secondary | ICD-10-CM

## 2019-05-14 DIAGNOSIS — F411 Generalized anxiety disorder: Secondary | ICD-10-CM

## 2019-05-14 DIAGNOSIS — Z13220 Encounter for screening for lipoid disorders: Secondary | ICD-10-CM

## 2019-05-14 DIAGNOSIS — Z1151 Encounter for screening for human papillomavirus (HPV): Secondary | ICD-10-CM

## 2019-05-14 DIAGNOSIS — N926 Irregular menstruation, unspecified: Secondary | ICD-10-CM

## 2019-05-14 DIAGNOSIS — N3281 Overactive bladder: Secondary | ICD-10-CM | POA: Diagnosis not present

## 2019-05-14 DIAGNOSIS — R3915 Urgency of urination: Secondary | ICD-10-CM

## 2019-05-14 MED ORDER — VENLAFAXINE HCL ER 37.5 MG PO CP24: 37.5000 mg | ORAL_CAPSULE | Freq: Every day | ORAL | 1 refills | Status: DC

## 2019-05-14 NOTE — Patient Instructions (Signed)
Schedule your mammogram  Arrange for colonoscopy  Try over-the-counter Oxytrol patch for overactive bladder.  Started on Effexor daily call in 1 to 2 months to let me know how you are doing and for dose adjustment if needed

## 2019-05-14 NOTE — Addendum Note (Signed)
Addended by: Nelva Nay on: 05/14/2019 11:25 AM   Modules accepted: Orders

## 2019-05-14 NOTE — Progress Notes (Signed)
Sophia Vance 1966/06/21 [id number]        53 y.o.  G1P1 for annual gynecologic exam.  Complains of irregular menses over the past year or so where she will skip several months at a time.  Also having hot flushes and sweats.  Tried OTC product for menopausal symptoms and did well for a month or 2 but then her symptoms returned.  Had been on Prozac but has been off of it now for several months and notes increasing anxiety.  Recently had her mother passed away.  She also had issues with job loss and starting a new job.  Lastly patient notes over the past year or so urgency symptoms where she will lose urine if she does not make it to the bathroom.  No pain with urination or SUI symptoms.  Past medical history,surgical history, problem list, medications, allergies, family history and social history were all reviewed and documented as reviewed in the EPIC chart.  ROS:  Performed with pertinent positives and negatives included in the history, assessment and plan.   Additional significant findings : None   Exam: Caryn Bee assistant Vitals:   05/14/19 1043  BP: 130/86  Weight: 156 lb (70.8 kg)  Height: 5\' 5"  (1.651 m)   Body mass index is 25.96 kg/m.  General appearance:  Normal affect, orientation and appearance. Skin: Grossly normal HEENT: Without gross lesions.  No cervical or supraclavicular adenopathy. Thyroid normal.  Lungs:  Clear without wheezing, rales or rhonchi Cardiac: RR, without RMG Abdominal:  Soft, nontender, without masses, guarding, rebound, organomegaly or hernia Breasts:  Examined lying and sitting without masses, retractions, discharge or axillary adenopathy. Pelvic:  Ext, BUS, Vagina: Normal  Cervix: Normal.  Pap smear/HPV  Uterus: Anteverted, normal size, shape and contour, midline and mobile nontender   Adnexa: Without masses or tenderness    Anus and perineum: Normal   Rectovaginal: Normal sphincter tone without palpated masses or tenderness.     Assessment/Plan:  53 y.o. G1P1 female for annual gynecologic exam.  With irregular menses no contraception  1. Perimenopause.  Patient's menses are becoming more irregular and she is experiencing hot flushes and sweats.  Has tried over-the-counter products with some success but they seem to have worn off.  We discussed treatment options to include OTC products and prescriptions both hormonal and nonhormonal.  In conjunction with treatment for her anxiety we discussed starting Effexor and seeing how she does with this which may address both problems.  She agrees with this.  We will start with Effexor XR 37.5 mg daily.  She will call me in 1 month to let me know how she is doing and we will see about dosage adjustment.  I gave her a 5-month supply. 2. Anxiety.  Had been on Prozac but stopped several months ago with worsening anxiety.  We will go ahead and start Effexor as above and we will see how she does with this. 3. OAB.  With classic OAB symptoms.  Check urine analysis today.  Discussed options to include behavior modification as well as medications.  She is going to try OTC Oxytrol and see how she does with this.  Discussed other medication such as Vesicare/Enablex or Myrbetriq 4. Mammography 2017.  Recommended she call and schedule baseline mammogram and she agrees to do so.  She had a nodule palpated in her right and a nodule in her left breast previously.  I am unable to palpate those now and she cannot feel them on self  breast exam.  Will follow-up on mammogram and self breast exams monthly and report any findings. 5. Pap smear/HPV 2015.  Pap smear/HPV today.  No history of abnormal Pap smears previously. 6. Health maintenance.  Requests baseline labs.  CBC, CMP, lipid profile, TSH ordered.  Follow-up 1 year, sooner as needed   [name] MD, 11:16 AM 05/14/2019

## 2019-05-15 ENCOUNTER — Encounter: Payer: Self-pay | Admitting: Gynecology

## 2019-05-15 LAB — NO CULTURE INDICATED

## 2019-05-15 LAB — PAP IG AND HPV HIGH-RISK: HPV DNA High Risk: NOT DETECTED

## 2019-05-15 LAB — COMPREHENSIVE METABOLIC PANEL
AG Ratio: 2 (calc) (ref 1.0–2.5)
ALT: 17 U/L (ref 6–29)
AST: 20 U/L (ref 10–35)
Albumin: 4.4 g/dL (ref 3.6–5.1)
Alkaline phosphatase (APISO): 80 U/L (ref 37–153)
BUN: 7 mg/dL (ref 7–25)
CO2: 27 mmol/L (ref 20–32)
Calcium: 9.4 mg/dL (ref 8.6–10.4)
Chloride: 106 mmol/L (ref 98–110)
Creat: 0.76 mg/dL (ref 0.50–1.05)
Globulin: 2.2 g/dL (calc) (ref 1.9–3.7)
Glucose, Bld: 89 mg/dL (ref 65–99)
Potassium: 3.7 mmol/L (ref 3.5–5.3)
Sodium: 141 mmol/L (ref 135–146)
Total Bilirubin: 0.3 mg/dL (ref 0.2–1.2)
Total Protein: 6.6 g/dL (ref 6.1–8.1)

## 2019-05-15 LAB — CBC WITH DIFFERENTIAL/PLATELET
Absolute Monocytes: 818 cells/uL (ref 200–950)
Basophils Absolute: 37 cells/uL (ref 0–200)
Basophils Relative: 0.4 %
Eosinophils Absolute: 93 cells/uL (ref 15–500)
Eosinophils Relative: 1 %
HCT: 41.7 % (ref 35.0–45.0)
Hemoglobin: 14.4 g/dL (ref 11.7–15.5)
Lymphs Abs: 1079 cells/uL (ref 850–3900)
MCH: 31.6 pg (ref 27.0–33.0)
MCHC: 34.5 g/dL (ref 32.0–36.0)
MCV: 91.6 fL (ref 80.0–100.0)
MPV: 10.4 fL (ref 7.5–12.5)
Monocytes Relative: 8.8 %
Neutro Abs: 7273 cells/uL (ref 1500–7800)
Neutrophils Relative %: 78.2 %
Platelets: 266 10*3/uL (ref 140–400)
RBC: 4.55 10*6/uL (ref 3.80–5.10)
RDW: 12.3 % (ref 11.0–15.0)
Total Lymphocyte: 11.6 %
WBC: 9.3 10*3/uL (ref 3.8–10.8)

## 2019-05-15 LAB — URINALYSIS, COMPLETE W/RFL CULTURE
Bacteria, UA: NONE SEEN /HPF
Bilirubin Urine: NEGATIVE
Glucose, UA: NEGATIVE
Hgb urine dipstick: NEGATIVE
Hyaline Cast: NONE SEEN /LPF
Ketones, ur: NEGATIVE
Leukocyte Esterase: NEGATIVE
Nitrites, Initial: NEGATIVE
Protein, ur: NEGATIVE
RBC / HPF: NONE SEEN /HPF (ref 0–2)
Specific Gravity, Urine: 1.006 (ref 1.001–1.03)
Squamous Epithelial / HPF: NONE SEEN /HPF (ref <=5)
WBC, UA: NONE SEEN /HPF (ref 0–5)
pH: 6.5 (ref 5.0–8.0)

## 2019-05-15 LAB — LIPID PANEL
Cholesterol: 214 mg/dL — ABNORMAL HIGH (ref <200)
HDL: 53 mg/dL (ref >=50)
LDL Cholesterol (Calc): 131 mg/dL (calc) — ABNORMAL HIGH
Non-HDL Cholesterol (Calc): 161 mg/dL (calc) — ABNORMAL HIGH (ref <130)
Total CHOL/HDL Ratio: 4 (calc) (ref <5.0)
Triglycerides: 167 mg/dL — ABNORMAL HIGH (ref <150)

## 2019-05-15 LAB — TSH: TSH: 1 mIU/L

## 2019-05-16 ENCOUNTER — Emergency Department (HOSPITAL_COMMUNITY)
Admission: EM | Admit: 2019-05-16 | Discharge: 2019-05-16 | Disposition: A | Discharge status: Home / Self Care | Payer: Commercial Managed Care - PPO | Attending: Emergency Medicine | Admitting: Emergency Medicine

## 2019-05-16 ENCOUNTER — Emergency Department (HOSPITAL_COMMUNITY): Payer: Commercial Managed Care - PPO

## 2019-05-16 ENCOUNTER — Other Ambulatory Visit: Payer: Self-pay

## 2019-05-16 DIAGNOSIS — R509 Fever, unspecified: Secondary | ICD-10-CM | POA: Diagnosis present

## 2019-05-16 DIAGNOSIS — R0602 Shortness of breath: Secondary | ICD-10-CM | POA: Diagnosis not present

## 2019-05-16 DIAGNOSIS — R110 Nausea: Secondary | ICD-10-CM | POA: Diagnosis not present

## 2019-05-16 DIAGNOSIS — F17210 Nicotine dependence, cigarettes, uncomplicated: Secondary | ICD-10-CM | POA: Insufficient documentation

## 2019-05-16 DIAGNOSIS — R05 Cough: Secondary | ICD-10-CM | POA: Insufficient documentation

## 2019-05-16 DIAGNOSIS — J449 Chronic obstructive pulmonary disease, unspecified: Secondary | ICD-10-CM | POA: Insufficient documentation

## 2019-05-16 DIAGNOSIS — Z7982 Long term (current) use of aspirin: Secondary | ICD-10-CM | POA: Diagnosis not present

## 2019-05-16 DIAGNOSIS — R0789 Other chest pain: Secondary | ICD-10-CM | POA: Diagnosis not present

## 2019-05-16 DIAGNOSIS — Z20822 Contact with and (suspected) exposure to covid-19: Secondary | ICD-10-CM

## 2019-05-16 DIAGNOSIS — U071 COVID-19: Secondary | ICD-10-CM | POA: Insufficient documentation

## 2019-05-16 DIAGNOSIS — R059 Cough, unspecified: Secondary | ICD-10-CM

## 2019-05-16 LAB — CBC WITH DIFFERENTIAL/PLATELET
Abs Immature Granulocytes: 0.02 10*3/uL (ref 0.00–0.07)
Basophils Absolute: 0 10*3/uL (ref 0.0–0.1)
Basophils Relative: 0 %
Eosinophils Absolute: 0 10*3/uL (ref 0.0–0.5)
Eosinophils Relative: 0 %
HCT: 41.7 % (ref 36.0–46.0)
Hemoglobin: 14.1 g/dL (ref 12.0–15.0)
Immature Granulocytes: 0 %
Lymphocytes Relative: 30 %
Lymphs Abs: 2 10*3/uL (ref 0.7–4.0)
MCH: 32 pg (ref 26.0–34.0)
MCHC: 33.8 g/dL (ref 30.0–36.0)
MCV: 94.6 fL (ref 80.0–100.0)
Monocytes Absolute: 0.6 10*3/uL (ref 0.1–1.0)
Monocytes Relative: 10 %
Neutro Abs: 4 10*3/uL (ref 1.7–7.7)
Neutrophils Relative %: 60 %
Platelets: 217 10*3/uL (ref 150–400)
RBC: 4.41 MIL/uL (ref 3.87–5.11)
RDW: 12.6 % (ref 11.5–15.5)
WBC: 6.7 10*3/uL (ref 4.0–10.5)
nRBC: 0 % (ref 0.0–0.2)

## 2019-05-16 LAB — COMPREHENSIVE METABOLIC PANEL
ALT: 46 U/L — ABNORMAL HIGH (ref 0–44)
AST: 45 U/L — ABNORMAL HIGH (ref 15–41)
Albumin: 3.8 g/dL (ref 3.5–5.0)
Alkaline Phosphatase: 86 U/L (ref 38–126)
Anion gap: 12 (ref 5–15)
BUN: 7 mg/dL (ref 6–20)
CO2: 26 mmol/L (ref 22–32)
Calcium: 8.7 mg/dL — ABNORMAL LOW (ref 8.9–10.3)
Chloride: 101 mmol/L (ref 98–111)
Creatinine, Ser: 0.89 mg/dL (ref 0.44–1.00)
GFR calc Af Amer: >60 mL/min (ref >60)
GFR calc non Af Amer: >60 mL/min (ref >60)
Glucose, Bld: 106 mg/dL — ABNORMAL HIGH (ref 70–99)
Potassium: 3.2 mmol/L — ABNORMAL LOW (ref 3.5–5.1)
Sodium: 139 mmol/L (ref 135–145)
Total Bilirubin: 0.4 mg/dL (ref 0.3–1.2)
Total Protein: 6.5 g/dL (ref 6.5–8.1)

## 2019-05-16 LAB — URINALYSIS, ROUTINE W REFLEX MICROSCOPIC
Bilirubin Urine: NEGATIVE
Glucose, UA: NEGATIVE mg/dL
Hgb urine dipstick: NEGATIVE
Ketones, ur: NEGATIVE mg/dL
Leukocytes,Ua: NEGATIVE
Nitrite: NEGATIVE
Protein, ur: NEGATIVE mg/dL
Specific Gravity, Urine: 1.004 — ABNORMAL LOW (ref 1.005–1.030)
pH: 6 (ref 5.0–8.0)

## 2019-05-16 LAB — I-STAT BETA HCG BLOOD, ED (MC, WL, AP ONLY): I-stat hCG, quantitative: <5 m[IU]/mL (ref <5)

## 2019-05-16 LAB — LIPASE, BLOOD: Lipase: 36 U/L (ref 11–51)

## 2019-05-16 LAB — SARS CORONAVIRUS 2 BY RT PCR (HOSPITAL ORDER, PERFORMED IN ~~LOC~~ HOSPITAL LAB): SARS Coronavirus 2: POSITIVE — AB

## 2019-05-16 NOTE — Addendum Note (Signed)
Addended by: Brigitte Pulse on: 05/16/2019 09:39 AM   Modules accepted: Orders

## 2019-05-16 NOTE — ED Notes (Signed)
Made PA aware of positive Covid result.

## 2019-05-16 NOTE — ED Notes (Signed)
Portable XR at the bedside.

## 2019-05-16 NOTE — ED Notes (Signed)
Urine culture sent to lab along with the urinalysis

## 2019-05-16 NOTE — Discharge Instructions (Addendum)
You have been seen today for fever, chills, and cough. Please read and follow all provided instructions.   1. Medications: tylenol for fever, usual home medications 2. Treatment: rest, drink plenty of fluids 3. Follow Up: Please follow up with your primary doctor in 2-5 days for discussion of your diagnoses and further evaluation after today's visit; if you do not have a primary care doctor use the resource guide provided to find one; Please return to the ER for any new or worsening symptoms. Please obtain all of your results from medical records or have your doctors office obtain the results - share them with your doctor - you should be seen at your doctors office. Call today to arrange your follow up.  4. Please follow instructions for isolation. You tested positive for COVID-19. Please isolate yourself for at least 3 days since recovery without a fever, improvement in respiratory symptoms (cough, shortness of breath), and at least 7 days have passed since symptoms first appeared.   Take medications as prescribed. Please review all of the medicines and only take them if you do not have an allergy to them. Return to the emergency room for worsening condition or new concerning symptoms. Follow up with your regular doctor. If you don't have a regular doctor use one of the numbers below to establish a primary care doctor.  Please be aware that if you are taking birth control pills, taking other prescriptions, ESPECIALLY ANTIBIOTICS may make the birth control ineffective - if this is the case, either do not engage in sexual activity or use alternative methods of birth control such as condoms until you have finished the medicine and your family doctor says it is OK to restart them. If you are on a blood thinner such as COUMADIN, be aware that any other medicine that you take may cause the coumadin to either work too much, or not enough - you should have your coumadin level rechecked in next 7 days if this is  the case.  ?  It is also a possibility that you have an allergic reaction to any of the medicines that you have been prescribed - Everybody reacts differently to medications and while MOST people have no trouble with most medicines, you may have a reaction such as nausea, vomiting, rash, swelling, shortness of breath. If this is the case, please stop taking the medicine immediately and contact your physician.  ?  You should return to the ER if you develop severe or worsening symptoms.   Emergency Department Resource Guide 1) Find a Doctor and Pay Out of Pocket Although you won't have to find out who is covered by your insurance plan, it is a good idea to ask around and get recommendations. You will then need to call the office and see if the doctor you have chosen will accept you as a new patient and what types of options they offer for patients who are self-pay. Some doctors offer discounts or will set up payment plans for their patients who do not have insurance, but you will need to ask so you aren't surprised when you get to your appointment.  2) Contact Your Local Health Department Not all health departments have doctors that can see patients for sick visits, but many do, so it is worth a call to see if yours does. If you don't know where your local health department is, you can check in your phone book. The CDC also has a tool to help you locate your state's  health department, and many state websites also have listings of all of their local health departments.  3) Find a [facility] If your illness is not likely to be very severe or complicated, you may want to try a walk in clinic. These are popping up all over the country in pharmacies, drugstores, and shopping centers. They're usually staffed by nurse practitioners or physician assistants that have been trained to treat common illnesses and complaints. They're usually fairly quick and inexpensive. However, if you have serious medical issues  or chronic medical problems, these are probably not your best option.  No Primary Care Doctor: Call Health Connect at  [phone] - they can help you locate a primary care doctor that  accepts your insurance, provides certain services, etc. Physician Referral Service- [phone]  Chronic Pain Problems: Organization         Address  Phone   Notes  [facility]  [phone] Patients need to be referred by their primary care doctor.   Medication Assistance: Organization         Address  Phone   Notes  [facility] Medication [facility] [address] [place], [place] [address] [phone] --Must be a resident of [facility] -- Must have NO insurance coverage whatsoever (no Medicaid/ Medicare, etc.) -- The pt. MUST have a primary care doctor that directs their care regularly and follows them in the community   [facility]  [phone]   [employer]  [phone]    Agencies that provide inexpensive medical care: Organization         Address  Phone   Notes  [place]  [phone]   [facility]    [phone]   [facility] [place], [place] [address] [phone]   [place] [address] [place] [phone]   [facility]    [phone]   [facility]    [phone]   [place] and [place] [address], [place] Phone:  [phone], Fax:  [phone] Hours of Operation:  9 am - 6 pm, M-F.  Also accepts Medicaid/Medicare and self-pay.  [facility] for [place] [address], [address], [place] Phone: [phone], Fax: [phone]. Hours of Operation:  8:30 am - 5:30 pm, M-F.  Also accepts Medicaid and self-pay.  [facility] [address] [place] Phone: [phone]   [place], [place], [place]  [phone], Ext. [phone] Mondays & Thursdays: 7-9 AM.  First 15 patients are seen on a first come, first serve basis.    [place] Providers:  Organization         Address  Phone   Notes  [facility] [address], [place] [phone] Also accepts self-pay patients.  [facility] [address], [place]  [phone]   [place], [address], [place] [phone]   [facility] [address], [place] [phone]   Lucianne Lei [address], [place]   [phone] Only accepts [place] patients after they have their name applied to their card.   Self-Pay (no insurance) in [facility]:  Organization  Address  Phone   Notes  Sickle Cell Patients, [facility] [place] [phone]   [facility] [place] [phone]   [facility] [place]  [place], [address],  [phone]   [facility]/Dr. Osei-Bonsu  [address], [place] or [address], [place] [phone] Phone number for both [place] and [place] locations is the same.  [facility] and [facility] [address], [place] [phone]   [facility] [address] [place] or [address] [phone]   [facility] [address] [place] [phone], phone; [phone], fax Sees patients 1st and 3rd Saturday of every month.  Must not qualify for public or private insurance (i.e. Medicaid, Medicare, [place] [employer], Veterans' Benefits)  Household income should be no more than 200% of the poverty level The clinic cannot treat you if you are pregnant or think you are pregnant  Sexually transmitted  diseases are not treated at the clinic.

## 2019-05-16 NOTE — ED Notes (Signed)
Patient verbalizes understanding of discharge instructions. Opportunity for questioning and answers were provided. Armband removed by staff, pt discharged from ED.  

## 2019-05-16 NOTE — ED Triage Notes (Signed)
Pt here due to c/o fever, chills, nausea, cough, and abdominal pain x 24 hours. Loss of sense of taste and smell.

## 2019-05-16 NOTE — ED Notes (Signed)
Got patient on the monitor patient is resting with call bell in reach  ?

## 2019-05-16 NOTE — ED Provider Notes (Signed)
[facility] EMERGENCY DEPARTMENT Provider Note   CSN: [id number] Arrival date & time: 05/16/19  0850    History   Chief Complaint Chief Complaint  Patient presents with  . Abdominal Pain  . Fever  . Chills    HPI Sophia Vance is a 53 y.o. female with a PMH of COPD, Anxiety, and Chest pain presenting with fever, intermittent dry cough, congestion, and chills onset yesterday. Patient states nothing makes symptoms worse and advil helps alleviate symptoms. 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 Patient reports associated abdominal discomfort and nausea. Patient reports intermittent shortness of breath. Patient denies vomiting, diarrhea, or constipation. Patient reports loss of sense of taste and smell. Patient reports fever was 100.9 earlier today and patient took advil prior to arrival. Patient reports she was at a funeral and has been around many people without masks and is unsure of sick contact exposures. Patient denies recent travel or chest pain.      HPI  Past Medical History:  Diagnosis Date  . Anxiety   . Chest pain   . COPD (chronic obstructive pulmonary disease) [facility])     Patient Active Problem List   Diagnosis Date Noted  . Sinusitis 12/28/2010  . CHRONIC OBSTRUCTIVE PULMONARY DISEASE 09/21/2010  . TOBACCO ABUSE 09/07/2010  . DEPRESSION 09/07/2010  . Dyspnea on exertion 09/07/2010  . CHEST PAIN, ATYPICAL, HX OF 09/07/2010    Past Surgical History:  Procedure Laterality Date  . APPENDECTOMY    . BREAST SURGERY     Biopsy neg.     OB History    Gravida  1   Para  1   Term      Preterm      AB      Living  1     SAB      TAB      Ectopic      Multiple      Live Births               Home Medications    Prior to Admission medications   Medication Sig Start Date End Date Taking? Authorizing Provider  albuterol (VENTOLIN HFA) 108 (90 Base) MCG/ACT  inhaler Inhale 1-2 puffs into the lungs every 6 (six) hours as needed for wheezing or shortness of breath.   Yes [provider]  ibuprofen (ADVIL,MOTRIN) 200 MG tablet Take 800 mg by mouth every 6 (six) hours as needed for headache (pain).   Yes [provider]  venlafaxine XR (EFFEXOR-XR) 37.5 MG 24 hr capsule Take 1 capsule (37.5 mg total) by mouth daily with breakfast. 05/14/19  Yes Fontaine, Belinda Block, MD  aspirin EC 81 MG tablet Take 81 mg by mouth daily.    [provider]  FLUoxetine (PROZAC) 20 MG tablet Take 1 tablet (20 mg total) by mouth daily. Patient not taking: Reported on 05/14/2019 09/21/16   Fontaine, Belinda Block, MD  STIOLTO RESPIMAT 2.5-2.5 MCG/ACT AERS INHALE 2 PUFFS INTO THE LUNGS DAILY Patient not taking: Reported on 05/14/2019 04/04/17   Marshell Garfinkel, MD  Tiotropium Bromide Monohydrate (SPIRIVA RESPIMAT) 1.25 MCG/ACT AERS Inhale 2 puffs into the lungs daily. Patient not taking: Reported on 05/14/2019 11/25/15   Marshell Garfinkel, MD    Family History Family History  Problem Relation Age of Onset  . Osteoarthritis Mother   . Heart failure Mother   . Hypertension Mother   . COPD Mother   .  Cancer Father        Throat  . Heart attack Maternal Grandfather     Social History Social History   Tobacco Use  . Smoking status: Current Every Day Smoker    Packs/day: 0.25    Years: 35.00    Pack years: 8.75    Types: Cigarettes  . Smokeless tobacco: Never Used  . Tobacco comment: 3 cigs per day (4.24.17)  Substance Use Topics  . Alcohol use: Yes    Alcohol/week: 0.0 standard drinks    Comment: Rare  . Drug use: No     Allergies   Patient has no known allergies.   Review of Systems Review of Systems  Constitutional: Positive for chills and fever. Negative for activity change, appetite change and diaphoresis.  HENT: Positive for congestion and rhinorrhea. Negative for facial swelling, sinus pressure, sinus pain, sore throat, trouble  swallowing and voice change.   Eyes: Negative for visual disturbance.  Respiratory: Positive for cough and shortness of breath.   Cardiovascular: Negative for chest pain, palpitations and leg swelling.  Gastrointestinal: Positive for abdominal pain and nausea. Negative for constipation, diarrhea and vomiting.  Endocrine: Negative for cold intolerance and heat intolerance.  Genitourinary: Negative for dysuria, flank pain and frequency.  Musculoskeletal: Negative for back pain.  Skin: Negative for rash.  Allergic/Immunologic: Negative for immunocompromised state.  Neurological: Negative for dizziness, weakness and headaches.  Hematological: Negative for adenopathy.     Physical Exam Updated Vital Signs BP 134/86 (BP Location: Right Arm)   Pulse 76   Temp 98.2 F (36.8 C) (Oral)   Resp 18   LMP 03/18/2019 (Approximate)   SpO2 98%   Physical Exam Vitals signs and nursing note reviewed.  Constitutional:      General: She is not in acute distress.    Appearance: She is well-developed. She is not diaphoretic.  HENT:     Head: Normocephalic and atraumatic.     Right Ear: Tympanic membrane, ear canal and external ear normal. No middle ear effusion.     Left Ear: Tympanic membrane, ear canal and external ear normal.  No middle ear effusion.     Nose: Mucosal edema and rhinorrhea present.     Mouth/Throat:     Mouth: Mucous membranes are moist.     Dentition: Normal dentition.     Pharynx: Uvula midline. No oropharyngeal exudate or posterior oropharyngeal erythema.     Comments: Patient is speaking in full sentences without difficulty. No signs of drooling or muffled voice. Eyes:     General:        Right eye: No discharge.        Left eye: No discharge.     Conjunctiva/sclera: Conjunctivae normal.  Neck:     Musculoskeletal: Normal range of motion and neck supple.  Cardiovascular:     Rate and Rhythm: Normal rate and regular rhythm.     Heart sounds: Normal heart sounds. No  murmur. No friction rub. No gallop.   Pulmonary:     Effort: Pulmonary effort is normal. No respiratory distress.     Breath sounds: Normal breath sounds. No wheezing or rales.     Comments: Patient is speaking in full sentences without difficulty. Oxygen saturation is 97% on room air.  Abdominal:     General: Bowel sounds are normal. There is no distension.     Palpations: Abdomen is soft.     Tenderness: There is no abdominal tenderness (No abdominal tenderness upon palpation.). There is  no right CVA tenderness, left CVA tenderness, guarding or rebound. Negative signs include Murphy's sign and McBurney's sign.  Musculoskeletal: Normal range of motion.  Lymphadenopathy:     Cervical: No cervical adenopathy.  Skin:    Findings: No rash.  Neurological:     Mental Status: She is alert and oriented to person, place, and time.     ED Treatments / Results  Labs (all labs ordered are listed, but only abnormal results are displayed) Labs Reviewed  SARS CORONAVIRUS 2 ([facility]) - Abnormal; Notable for the following components:      Result Value   SARS Coronavirus 2 POSITIVE (*)    All other components within normal limits  COMPREHENSIVE METABOLIC PANEL - Abnormal; Notable for the following components:   Potassium 3.2 (*)    Glucose, Bld 106 (*)    Calcium 8.7 (*)    AST 45 (*)    ALT 46 (*)    All other components within normal limits  URINALYSIS, ROUTINE W REFLEX MICROSCOPIC - Abnormal; Notable for the following components:   APPearance HAZY (*)    Specific Gravity, Urine 1.004 (*)    All other components within normal limits  CBC WITH DIFFERENTIAL/PLATELET  LIPASE, BLOOD  I-STAT BETA HCG BLOOD, ED ([facility], [facility], [facility] ONLY)    EKG None  Radiology Dg Chest Port 1 View  Result Date: 05/16/2019 CLINICAL DATA:  Fever and shortness of breath.  Cough EXAM: PORTABLE CHEST 1 VIEW COMPARISON:  January 16, 2019 FINDINGS: No edema or consolidation.  Heart size and pulmonary vascularity are normal. No adenopathy. No bone lesions. IMPRESSION: No edema or consolidation. Electronically Signed   By: Lowella Grip III M.D.   On: 05/16/2019 11:52    Procedures Procedures (including critical care time)  Medications Ordered in ED Medications - No data to display   Initial Impression / Assessment and Plan / ED Course  I have reviewed the triage vital signs and the nursing notes.  Pertinent labs & imaging results that were available during my care of the patient were reviewed by me and considered in my medical decision making (see chart for details).  Clinical Course as of May 15 1399  Sat May 16, 2019  1010 CBC is unremarkable.  CBC with Differential [AH]  1125 COVID-19 result is positive.  SARS Coronavirus 2(!): POSITIVE [AH]  1154 No edema or consolidation.  DG Chest Port 1 View [AH]    Clinical Course User Index [AH] Arville Lime, [place]      Patient presents with cough, fever, congestion, and nausea. Labs, vitals, and imaging reviewed. Patient is afebrile in the ER. CBC is unremarkable. COVID-19 test is positive. Oxygen saturations are stable on room air. CXR is negative. Patient is stable in no acute distress. Discussed quarantine instructions in detail per CDC guidelines. Patient signed documents stating she understands guidelines. Patient does not meet inpatient criteria at this time. Discussed return precautions. Advised patient to follow up with PCP. Patient states she understands and agrees with plan.    Sophia Vance was evaluated in Emergency Department on 05/16/2019 for the symptoms described in the history of present illness. She was evaluated in the context of the global COVID-19 pandemic, which necessitated consideration that the patient might be at risk for infection with the SARS-CoV-2 virus that causes COVID-19. Institutional protocols and algorithms that pertain to the evaluation of patients at risk for COVID-19 are in  a state of rapid change based  on information released by regulatory bodies including the CDC and federal and state organizations. These policies and algorithms were followed during the patient's care in the ED.  Final Clinical Impressions(s) / ED Diagnoses   Final diagnoses:  [id number]-[id number] virus detected  Cough  Fever in adult    ED Discharge Orders    None       Arville Lime, [place] 05/16/19 1405    Virgel Manifold, MD 05/17/19 [phone]

## 2019-05-25 ENCOUNTER — Institutional Professional Consult (permissible substitution): Payer: Commercial Managed Care - PPO | Admitting: Pulmonary Disease

## 2019-05-27 ENCOUNTER — Encounter: Payer: Self-pay | Admitting: Family Medicine

## 2019-05-27 ENCOUNTER — Telehealth: Payer: Self-pay

## 2019-05-27 ENCOUNTER — Ambulatory Visit (INDEPENDENT_AMBULATORY_CARE_PROVIDER_SITE_OTHER): Payer: Commercial Managed Care - PPO | Admitting: Family Medicine

## 2019-05-27 VITALS — HR 92 | Temp 98.6°F | Ht 65.0 in | Wt 156.0 lb

## 2019-05-27 DIAGNOSIS — U071 COVID-19: Secondary | ICD-10-CM | POA: Diagnosis not present

## 2019-05-27 DIAGNOSIS — F17200 Nicotine dependence, unspecified, uncomplicated: Secondary | ICD-10-CM | POA: Diagnosis not present

## 2019-05-27 DIAGNOSIS — Z7689 Persons encountering health services in other specified circumstances: Secondary | ICD-10-CM

## 2019-05-27 DIAGNOSIS — Z1211 Encounter for screening for malignant neoplasm of colon: Secondary | ICD-10-CM

## 2019-05-27 DIAGNOSIS — J449 Chronic obstructive pulmonary disease, unspecified: Secondary | ICD-10-CM

## 2019-05-27 MED ORDER — ALBUTEROL SULFATE HFA 108 (90 BASE) MCG/ACT IN AERS: 1.0000 | INHALATION_SPRAY | Freq: Four times a day (QID) | RESPIRATORY_TRACT | 1 refills | Status: DC | PRN

## 2019-05-27 MED ORDER — SPIRIVA RESPIMAT 1.25 MCG/ACT IN AERS: 2.0000 | INHALATION_SPRAY | Freq: Every day | RESPIRATORY_TRACT | 3 refills | Status: DC

## 2019-05-27 MED ORDER — NICOTINE 7 MG/24HR TD PT24: 7.0000 mg | MEDICATED_PATCH | Freq: Every day | TRANSDERMAL | 2 refills | Status: DC

## 2019-05-27 NOTE — Telephone Encounter (Signed)
Please clarify with patient. When I spoke with her, she reported a new cough in the last couple of days. Is the symptom to which she is referring? The lost of sense of taste and smell can take awhile to come back so this would not indicate whether or not she is contagious. It is not worthwhile to retest as your test can remain positive for weeks or more and is not an indication of being contagious. s

## 2019-05-27 NOTE — Telephone Encounter (Signed)
Spoke with pt relaying Debbie's message.  Pt verbalizes understanding.  Says the cough is a little different than the cough she had last week.  Pt insists she needs a letter either stating she is contagious and should not return to work just yet or she is not contagious and may return to work.

## 2019-05-27 NOTE — Patient Instructions (Addendum)
Hi Sophia Vance,  Is nice to meet you for your virtual visit today to establish care.  With regards to your recent COVID-19 infection, I recommend that you continue to wear a mask while in public, socially distance from others, avoid large crowds and wash your hands regularly..  It is still too early to know if you are protected against getting COVID-19 again.  As we discussed, it is important for you to follow-up with the pulmonologist for your COPD.  I have sent in refills of your Spiriva and albuterol inhalers.  I have sent in a prescription for nicotine patches for you to use to help you quit smoking.  You also find some information below about smoking cessation.  I have put in an order for referral to gastroenterology for screening colonoscopy.  They should be calling you within 5-7 business days.  Please call and schedule an appointment for screening mammogram. A referral is not needed.  You may also be on a schedule on your MyChart account if you are able to get logged on. [place].  Please follow-up in 6 months.  Warm regards,  Tor Netters, FNP-BC   Coping with Quitting Smoking  Quitting smoking is a physical and mental challenge. You will face cravings, withdrawal symptoms, and temptation. Before quitting, work with your health care provider to make a plan that can help you cope. Preparation can help you quit and keep you from giving in. How can I cope with cravings? Cravings usually last for 5-10 minutes. If you get through it, the craving will pass. Consider taking the following actions to help you cope with cravings:  Keep your mouth busy: ? Chew sugar-free gum. ? Suck on hard candies or a straw. ? Brush your teeth.  Keep your hands and body busy: ? Immediately change to a different activity when you feel a craving. ? Squeeze or play with a ball. ? Do an activity or a hobby, like making bead jewelry, practicing needlepoint, or working with  wood. ? Mix up your normal routine. ? Take a short exercise break. Go for a quick walk or run up and down stairs. ? Spend time in public places where smoking is not allowed.  Focus on doing something kind or helpful for someone else.  Call a friend or family member to talk during a craving.  Join a support group.  Call a quit line, such as 1-800-QUIT-NOW.  Talk with your health care provider about medicines that might help you cope with cravings and make quitting easier for you. How can I deal with withdrawal symptoms? Your body may experience negative effects as it tries to get used to not having nicotine in the system. These effects are called withdrawal symptoms. They may include:  Feeling hungrier than normal.  Trouble concentrating.  Irritability.  Trouble sleeping.  Feeling depressed.  Restlessness and agitation.  Craving a cigarette. To manage withdrawal symptoms:  Avoid places, people, and activities that trigger your cravings.  Remember why you want to quit.  Get plenty of sleep.  Avoid coffee and other caffeinated drinks. These may worsen some of your symptoms. How can I handle social situations? Social situations can be difficult when you are quitting smoking, especially in the first few weeks. To manage this, you can:  Avoid parties, bars, and other social situations where people might be smoking.  Avoid alcohol.  Leave right away if you have the urge to smoke.  Explain to your family and friends that  you are quitting smoking. Ask for understanding and support.  Plan activities with friends or family where smoking is not an option. What are some ways I can cope with stress? Wanting to smoke may cause stress, and stress can make you want to smoke. Find ways to manage your stress. Relaxation techniques can help. For example:  Breathe slowly and deeply, in through your nose and out through your mouth.  Listen to soothing, relaxing music.  Talk with a  family member or friend about your stress.  Light a candle.  Soak in a bath or take a shower.  Think about a peaceful place. What are some ways I can prevent weight gain? Be aware that many people gain weight after they quit smoking. However, not everyone does. To keep from gaining weight, have a plan in place before you quit and stick to the plan after you quit. Your plan should include:  Having healthy snacks. When you have a craving, it may help to: ? Eat plain popcorn, crunchy carrots, celery, or other cut vegetables. ? Chew sugar-free gum.  Changing how you eat: ? Eat small portion sizes at meals. ? Eat 4-6 small meals throughout the day instead of 1-2 large meals a day. ? Be mindful when you eat. Do not watch television or do other things that might distract you as you eat.  Exercising regularly: ? Make time to exercise each day. If you do not have time for a long workout, do short bouts of exercise for 5-10 minutes several times a day. ? Do some form of strengthening exercise, like weight lifting, and some form of aerobic exercise, like running or swimming.  Drinking plenty of water or other low-calorie or no-calorie drinks. Drink 6-8 glasses of water daily, or as much as instructed by your health care provider. Summary  Quitting smoking is a physical and mental challenge. You will face cravings, withdrawal symptoms, and temptation to smoke again. Preparation can help you as you go through these challenges.  You can cope with cravings by keeping your mouth busy (such as by chewing gum), keeping your body and hands busy, and making calls to family, friends, or a helpline for people who want to quit smoking.  You can cope with withdrawal symptoms by avoiding places where people smoke, avoiding drinks with caffeine, and getting plenty of rest.  Ask your health care provider about the different ways to prevent weight gain, avoid stress, and handle social situations. This  information is not intended to replace advice given to you by your health care provider. Make sure you discuss any questions you have with your health care provider. Document Released: 10/19/2016 Document Revised: 10/04/2017 Document Reviewed: 10/19/2016 Elsevier Patient Education  2020 [employer].

## 2019-05-27 NOTE — Telephone Encounter (Signed)
Please call patient and let her know that the letter has been written and is at front desk. She can also access via mychart.

## 2019-05-27 NOTE — Telephone Encounter (Signed)
Pt did virtual visit with Debbie this morning. She had tested positive for Covid 05-16-19 and is still symptomatic. She said she needs a letter for work. Her boss' question is "Is this patient still contagious or not". Because she is still symptomatic does she need another test to see if she is positive. She says she possibly knows where she can go to get it back quicker. She is working from home but they want to know if she is safe to come in the office. Please advise her if a letter can be written that she is safe to go to work or not.  Call her at [phone].

## 2019-05-27 NOTE — Telephone Encounter (Signed)
Spoke with pt relaying Debbie's message.  Pt verbalizes understanding and is requesting a note stating info, as recommended by Jackelyn Poling, for pt to provide for her job.

## 2019-05-27 NOTE — Progress Notes (Signed)
Virtual Visit via Video Note  I connected with Sophia Vance on 05/27/19 at 11:46 AM EDT by a video enabled telemedicine application and verified that I am speaking with the correct person using two identifiers.  Location: Patient: In her home Provider: [place]   I discussed the limitations of evaluation and management by telemedicine and the availability of in person appointments. The patient expressed understanding and agreed to proceed.  History of Present Illness: This is a 53 yo female who requests virtual visit to establish care. She is married with 82 yo daughter. She is currently working from home.   Was recently seen in ER with + covid 19 testing. She has little cough, lt. Yellow sputum. Afebrile since 05/15/19. Has lost sense of taste and smell. Feels fatigued if she overdoes it. Occasional wheeze.   History of COPD- previously seen by Dr. Vaughan Browner. Had appointment but had canceled due to coronavirus.  She was previously on Spiriva and albuterol with control of symptoms.  She was out of work for 18 months and lost her insurance and was unable to afford visits for medication.  She is recently started to use Stiolitq, which was her mother's.  Her mother has recently passed away.  She has been using her 52 year old daughter's albuterol inhaler.  Tobacco abuse- according to the patient she is smoking about 4 cigarettes a day.  She is interested in quitting and would like a prescription for nicotine patches.    Last CPE- gyn 7/20 ([facility]) Mammo- 10/01/2016 Pap- 05/14/19 Colonoscopy- never screening, had many years ago, not in Epic (? Pre Epic) Tdap- 09/07/2010  Past Medical History:  Diagnosis Date  . Anxiety   . Chest pain   . COPD (chronic obstructive pulmonary disease) ([place])    Past Surgical History:  Procedure Laterality Date  . APPENDECTOMY    . BREAST SURGERY     Biopsy neg.   Family History  Problem Relation Age of Onset  . Osteoarthritis Mother   . Heart  failure Mother   . Hypertension Mother   . COPD Mother   . Breast cancer Mother   . Cancer Father        Throat, lung, brain  . Heart attack Maternal Grandfather   . Kidney failure Maternal Grandmother   . Breast cancer Paternal Grandmother    Social History   Tobacco Use  . Smoking status: Current Every Day Smoker    Packs/day: 0.25    Years: 35.00    Pack years: 8.75    Types: Cigarettes  . Smokeless tobacco: Never Used  . Tobacco comment: 3 cigs per day (4.24.17)  Substance Use Topics  . Alcohol use: Yes    Alcohol/week: 0.0 standard drinks    Comment: Rare  . Drug use: No      Observations/Objective: The patient is alert and answers questions appropriately.  Visible skin is unremarkable.  She is normally conversive without shortness of breath, audible wheeze or witnessed cough.  Mood and affect are appropriate.  Pulse 92   Temp 98.6 F (37 C)   Ht 5\' 5"  (1.651 m)   Wt 156 lb (70.8 kg)   SpO2 98%   BMI 25.96 kg/m  BP Readings from Last 3 Encounters:  05/16/19 134/86  05/14/19 130/86  03/09/19 110/74   Wt Readings from Last 3 Encounters:  05/27/19 156 lb (70.8 kg)  05/14/19 156 lb (70.8 kg)  03/27/19 154 lb (69.9 kg)    Assessment and Plan: 1. Encounter to  establish care -Reviewed overdue health maintenance and patient agrees to schedule appointment for mammogram.  Discussed colonoscopy and she agrees to referral. -Reviewed recent labs  2. Chronic obstructive pulmonary disease, unspecified COPD type ([place]) -We will restart her previous inhaler and encouraged her to follow-up with pulmonary - Tiotropium Bromide Monohydrate (SPIRIVA RESPIMAT) 1.25 MCG/ACT AERS; Inhale 2 puffs into the lungs daily.  Dispense: 4 g; Refill: 3 - albuterol (VENTOLIN HFA) 108 (90 Base) MCG/ACT inhaler; Inhale 1-2 puffs into the lungs every 6 (six) hours as needed for wheezing or shortness of breath.  Dispense: 18 g; Refill: 1  3. TOBACCO ABUSE -Encouraged her efforts at smoking  cessation, information sent via my chart as well as printed AVS - nicotine (NICODERM CQ - DOSED IN MG/24 HR) 7 mg/24hr patch; Place 1 patch (7 mg total) onto the skin daily.  Dispense: 28 patch; Refill: 2  4. Screening for colon cancer - Ambulatory referral to Gastroenterology  5. COVID-19 virus infection -Her test was +11 days ago and she has been afebrile for over a week.  Discussed return to clinic precautions and continuing normal precautions against COVID-19 such as masking, social distancing and avoiding crowds.  -Follow-up in 6 months Clarene Reamer, FNP-BC  [facility], [place]  05/27/2019 12:18 PM   Follow Up Instructions: Patient not sure if she can access her MyChart so visit recap was sent via my chart as well as AVS printed and mailed to her home address.   I discussed the assessment and treatment plan with the patient. The patient was provided an opportunity to ask questions and all were answered. The patient agreed with the plan and demonstrated an understanding of the instructions.   The patient was advised to call back or seek an in-person evaluation if the symptoms worsen or if the condition fails to improve as anticipated.   Elby Beck, FNP

## 2019-05-27 NOTE — Telephone Encounter (Signed)
Sent her a Pharmacist, community message regarding this as well. I can write her a note stating the she can return to work when she has been symptom free for 3 days. This means no cough, fever, sore throat, runny nose, etc. It would not include loss of taste/smell as these may take longer to return to normal.

## 2019-05-28 NOTE — Telephone Encounter (Signed)
Patient advised.

## 2019-05-29 ENCOUNTER — Other Ambulatory Visit: Payer: Self-pay | Admitting: Gynecology

## 2019-05-29 DIAGNOSIS — E7800 Pure hypercholesterolemia, unspecified: Secondary | ICD-10-CM

## 2019-05-29 NOTE — Telephone Encounter (Signed)
Spoke with patient and informed her as message was returned unread. Order and recall placed.

## 2019-06-18 ENCOUNTER — Other Ambulatory Visit: Payer: Self-pay

## 2019-06-18 ENCOUNTER — Encounter: Payer: Self-pay | Admitting: Pulmonary Disease

## 2019-06-18 ENCOUNTER — Ambulatory Visit (INDEPENDENT_AMBULATORY_CARE_PROVIDER_SITE_OTHER): Payer: Commercial Managed Care - PPO | Admitting: Pulmonary Disease

## 2019-06-18 VITALS — BP 116/80 | HR 92 | Temp 98.3°F | Ht 64.0 in | Wt 157.4 lb

## 2019-06-18 DIAGNOSIS — Z8616 Personal history of COVID-19: Secondary | ICD-10-CM

## 2019-06-18 DIAGNOSIS — J449 Chronic obstructive pulmonary disease, unspecified: Secondary | ICD-10-CM | POA: Diagnosis not present

## 2019-06-18 DIAGNOSIS — R0602 Shortness of breath: Secondary | ICD-10-CM

## 2019-06-18 DIAGNOSIS — Z8619 Personal history of other infectious and parasitic diseases: Secondary | ICD-10-CM | POA: Diagnosis not present

## 2019-06-18 MED ORDER — SPIRIVA RESPIMAT 1.25 MCG/ACT IN AERS: 2.0000 | INHALATION_SPRAY | Freq: Every day | RESPIRATORY_TRACT | 6 refills | Status: DC

## 2019-06-18 NOTE — Progress Notes (Signed)
Synopsis: Referred in August 2020 for COPD by Elby Beck, FNP  Subjective:   PATIENT ID: Sophia Vance GENDER: female DOB: 08/11/1966, MRN: [id number]  Chief Complaint  Patient presents with  . Consult    Positive for Covid in July. Reports she is still having SOB. She reportst she feels SOB. She reports increased chest pressure. Smokes 10 cigarettes per day.     Diagnosed with COVID 19, fever, cough, sweating, loss of taste and smell, on July 11th. Current smoker, smokes 1 ppd today, started smoking at age 39 yo.  Overall she has tried to quit smoking the past and has been unsuccessful.  She was given a prescription of Chantix in the past but never took the medication.  She understands that she needs to quit smoking but has been very difficult for her.  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  She has been able to work from home due to the pandemic as well as her diagnosis of COVID.  Her sister works for the court house and there was positive cases there.  Unfortunately her sister was diagnosed with COVID and asymptomatic which ended up spreading it to her niece as well as her and her husband.  Fortunately none of the family was hospitalized due to symptomatology.  She has been seen in our office before.  Back in 2017 she was evaluated with pulmonary function test and diagnosed with mild COPD.  She had an FEV1 of 91% predicted with a ratio less than 70.     Past Medical History:  Diagnosis Date  . Anxiety   . Chest pain   . COPD (chronic obstructive pulmonary disease) (HCC)      Family History  Problem Relation Age of Onset  . Osteoarthritis Mother   . Heart failure Mother   . Hypertension Mother   . COPD Mother   . Breast cancer Mother   . Cancer Father        Throat, lung, brain  . Heart attack Maternal Grandfather   . Kidney failure Maternal Grandmother   . Breast cancer Paternal Grandmother      Past Surgical History:  Procedure Laterality  Date  . APPENDECTOMY    . BREAST SURGERY     Biopsy neg.    Social History   Socioeconomic History  . Marital status: Married    Spouse name: Not on file  . Number of children: Not on file  . Years of education: Not on file  . Highest education level: Not on file  Occupational History  . Not on file  Social Needs  . Financial resource strain: Not on file  . Food insecurity    Worry: Not on file    Inability: Not on file  . Transportation needs    Medical: Not on file    Non-medical: Not on file  Tobacco Use  . Smoking status: Current Every Day Smoker    Packs/day: 0.25    Years: 35.00    Pack years: 8.75    Types: Cigarettes  . Smokeless tobacco: Never Used  . Tobacco comment: 3 cigs per day (4.24.17)  Substance and Sexual Activity  . Alcohol use: Yes    Alcohol/week: 0.0 standard drinks    Comment: Rare  . Drug use: No  . Sexual activity: Not Currently    Birth control/protection: None    Comment: 1st intercourse 32 yo-5 partners  Lifestyle  . Physical activity    Days per  week: Not on file    Minutes per session: Not on file  . Stress: Not on file  Relationships  . Social Herbalist on phone: Not on file    Gets together: Not on file    Attends religious service: Not on file    Active member of club or organization: Not on file    Attends meetings of clubs or organizations: Not on file    Relationship status: Not on file  . Intimate partner violence    Fear of current or ex partner: Not on file    Emotionally abused: Not on file    Physically abused: Not on file    Forced sexual activity: Not on file  Other Topics Concern  . Not on file  Social History Narrative   Married   1 child - daughter   Lives with spouse   Customer Service     No Known Allergies   Outpatient Medications Prior to Visit  Medication Sig Dispense Refill  . albuterol (VENTOLIN HFA) 108 (90 Base) MCG/ACT inhaler Inhale 1-2 puffs into the lungs every 6 (six) hours as  needed for wheezing or shortness of breath. 18 g 1  . ibuprofen (ADVIL,MOTRIN) 200 MG tablet Take 800 mg by mouth every 6 (six) hours as needed for headache (pain).    . nicotine (NICODERM CQ - DOSED IN MG/24 HR) 7 mg/24hr patch Place 1 patch (7 mg total) onto the skin daily. 28 patch 2  . Tiotropium Bromide Monohydrate (SPIRIVA RESPIMAT) 1.25 MCG/ACT AERS Inhale 2 puffs into the lungs daily. 4 g 3  . venlafaxine XR (EFFEXOR-XR) 37.5 MG 24 hr capsule Take 1 capsule (37.5 mg total) by mouth daily with breakfast. 30 capsule 1   No facility-administered medications prior to visit.     Review of Systems  Constitutional: Negative for chills, fever, malaise/fatigue and weight loss.  HENT: Negative for hearing loss, sore throat and tinnitus.   Eyes: Negative for blurred vision and double vision.  Respiratory: Positive for cough, shortness of breath and wheezing. Negative for hemoptysis, sputum production and stridor.   Cardiovascular: Negative for chest pain, palpitations, orthopnea, leg swelling and PND.  Gastrointestinal: Negative for abdominal pain, constipation, diarrhea, heartburn, nausea and vomiting.  Genitourinary: Negative for dysuria, hematuria and urgency.  Musculoskeletal: Negative for joint pain and myalgias.  Skin: Negative for itching and rash.  Neurological: Negative for dizziness, tingling, weakness and headaches.  Endo/Heme/Allergies: Negative for environmental allergies. Does not bruise/bleed easily.  Psychiatric/Behavioral: Negative for depression. The patient is not nervous/anxious and does not have insomnia.   All other systems reviewed and are negative.    Objective:  Physical Exam Vitals signs reviewed.  Constitutional:      General: She is not in acute distress.    Appearance: She is well-developed.  HENT:     Head: Normocephalic and atraumatic.  Eyes:     General: No scleral icterus.    Conjunctiva/sclera: Conjunctivae normal.     Pupils: Pupils are equal,  round, and reactive to light.  Neck:     Musculoskeletal: Neck supple.     Vascular: No JVD.     Trachea: No tracheal deviation.  Cardiovascular:     Rate and Rhythm: Normal rate and regular rhythm.     Heart sounds: Normal heart sounds. No murmur.  Pulmonary:     Effort: Pulmonary effort is normal. No tachypnea, accessory muscle usage or respiratory distress.     Breath sounds: Normal breath  sounds. No stridor. No wheezing, rhonchi or rales.  Abdominal:     General: Bowel sounds are normal. There is no distension.     Palpations: Abdomen is soft.     Tenderness: There is no abdominal tenderness.  Musculoskeletal:        General: No tenderness.  Lymphadenopathy:     Cervical: No cervical adenopathy.  Skin:    General: Skin is warm and dry.     Capillary Refill: Capillary refill takes less than 2 seconds.     Findings: No rash.  Neurological:     Mental Status: She is alert and oriented to person, place, and time.  Psychiatric:        Behavior: Behavior normal.      Vitals:   06/18/19 1602  BP: 116/80  Pulse: 92  Temp: 98.3 F (36.8 C)  TempSrc: Oral  SpO2: 99%  Weight: 157 lb 6.4 oz (71.4 kg)  Height: 5\' 4"  (1.626 m)   99% on RA BMI Readings from Last 3 Encounters:  06/18/19 27.02 kg/m  05/27/19 25.96 kg/m  05/14/19 25.96 kg/m   Wt Readings from Last 3 Encounters:  06/18/19 157 lb 6.4 oz (71.4 kg)  05/27/19 156 lb (70.8 kg)  05/14/19 156 lb (70.8 kg)     CBC    Component Value Date/Time   WBC 6.7 05/16/2019 0926   RBC 4.41 05/16/2019 0926   HGB 14.1 05/16/2019 0926   HCT 41.7 05/16/2019 0926   PLT 217 05/16/2019 0926   MCV 94.6 05/16/2019 0926   MCH 32.0 05/16/2019 0926   MCHC 33.8 05/16/2019 0926   RDW 12.6 05/16/2019 0926   LYMPHSABS 2.0 05/16/2019 0926   MONOABS 0.6 05/16/2019 0926   EOSABS 0.0 05/16/2019 0926   BASOSABS 0.0 05/16/2019 0926     Chest Imaging: Chest x-ray May 16, 2019 Clear, no infiltrate The patient's images have  been independently reviewed by me.    Pulmonary Functions Testing Results: PFT Results Latest Ref Rng & Units 02/23/2016  FVC-Pre L 3.74  FVC-Predicted Pre % 99  FVC-Post L 3.99  FVC-Predicted Post % 106  Pre FEV1/FVC % % 67  Post FEV1/FCV % % 70  FEV1-Pre L 2.49  FEV1-Predicted Pre % 83  FEV1-Post L 2.78  DLCO UNC% % 78  DLCO COR %Predicted % 71  TLC L 6.12  TLC % Predicted % 115  RV % Predicted % 127    FeNO: None  Pathology: None  Echocardiogram: None  Heart Catheterization: None    Assessment & Plan:     ICD-10-CM   1. SOB (shortness of breath)  R06.02   2. Chronic obstructive pulmonary disease, unspecified COPD type (HCC)  J44.9 Tiotropium Bromide Monohydrate (SPIRIVA RESPIMAT) 1.25 MCG/ACT AERS  3. History of 2019 novel coronavirus disease (COVID-19)  Z86.19     Discussion: This is a 53 year old current smoker, history of coronavirus in July 2020.  Current mild COPD managed with Spiriva Respimat.  She does need refills of this today and were happy to do so.  Continue use of albuterol for shortness of breath and wheezing.  Most of the days office visit was spent discussing smoking cessation counseling.  We discussed various methods of cessation counseling and stopping.  But I think the one that will work best for her is tapering.  She will plan to start the tapering method plus the addition of nicotine supplementation either via patch or lozenge to help reduce craving.  PFTs reviewed from 2017 today in  the office with patient.  Patient to follow-up with [place] in 3 months to make sure that she has been able to quit smoking or at least significantly reduce the number of cigarettes she is smoking.  Greater than 50% of this patient's 45-minute of visit was spent face-to-face discussing the above recommendations and treatment plan.   Current Outpatient Medications:  .  albuterol (VENTOLIN HFA) 108 (90 Base) MCG/ACT inhaler, Inhale 1-2 puffs into the lungs every 6 (six)  hours as needed for wheezing or shortness of breath., Disp: 18 g, Rfl: 1 .  ibuprofen (ADVIL,MOTRIN) 200 MG tablet, Take 800 mg by mouth every 6 (six) hours as needed for headache (pain)., Disp: , Rfl:  .  nicotine (NICODERM CQ - DOSED IN MG/24 HR) 7 mg/24hr patch, Place 1 patch (7 mg total) onto the skin daily., Disp: 28 patch, Rfl: 2 .  Tiotropium Bromide Monohydrate (SPIRIVA RESPIMAT) 1.25 MCG/ACT AERS, Inhale 2 puffs into the lungs daily., Disp: 4 g, Rfl: 3 .  venlafaxine XR (EFFEXOR-XR) 37.5 MG 24 hr capsule, Take 1 capsule (37.5 mg total) by mouth daily with breakfast., Disp: 30 capsule, Rfl: 1   Garner Nash, DO [facility] 06/18/2019 4:37 PM

## 2019-06-18 NOTE — Patient Instructions (Signed)
Thank you for visiting Dr. Valeta Harms at [facility]. Today we recommend the following:  Meds ordered this encounter  Medications  . Tiotropium Bromide Monohydrate (SPIRIVA RESPIMAT) 1.25 MCG/ACT AERS    Sig: Inhale 2 puffs into the lungs daily.    Dispense:  4 g    Refill:  6   Return in about 3 months (around 09/18/2019), or if symptoms worsen or fail to improve, for Follow up with Dr. Valeta Harms .  You must quit smoking or vaping. This is the single most important thing that you can do to improve your lung health.   S = Set a quit date. T = Tell family, friends, and the people around you that you plan to quit. A = Anticipate or plan ahead for the tough times you'll face while quitting. R = Remove cigarettes and other tobacco products from your home, car, and work T = Talk to [place] about getting help to quit  If you need help feel free to reach out to our office, [place]: [phone], call 1-800-QUIT-NOW, or visit [email or web address]    Please do your part to reduce the spread of COVID-19.

## 2019-06-24 ENCOUNTER — Telehealth: Payer: Self-pay | Admitting: Pulmonary Disease

## 2019-06-24 NOTE — Telephone Encounter (Signed)
Attempted to call patient, no answer, left message to call back.  

## 2019-06-24 NOTE — Telephone Encounter (Signed)
Per Dr. Fabio Bering note he did not order Trelegy at the last visit.  He needs to ok that. It is ok to send in nicotine patches.

## 2019-06-24 NOTE — Telephone Encounter (Signed)
Called and spoke with Patient.  Patient was seen by Dr Valeta Harms this week and is requesting to try Trelegy.  Patient stated she discussed nicotine patches with Dr Valeta Harms.  Patient stated he was going to prescribe the high dose patches, but forgot. Patient is aware Dr Valeta Harms is not in the office at this time, and someone will call her tomorrow with NP recommendations.  Message routed to [facility], Provider of the day

## 2019-06-25 NOTE — Telephone Encounter (Signed)
Pt returning call and can be reached @ [phone].Sophia Vance

## 2019-06-25 NOTE — Telephone Encounter (Signed)
Called the patient back and advised her of response received. The patient said that since she is now working from home because of covid she smokes 10 cigarettes a day. Told the patient this updated information would be forwarded to Sarah to confirm the 14 mg patch would still be the best option.  Message routed to Eric Form, NP for review and response.  Judson Roch, the patient said earlier in the call that she smokes one cigarette an hour. Which during waking hours would be 12 - 14 cigarettes (?). Then she changed to 10 a day when asked for how many total she smokes during the day. Would the 14 mg still be appropriate to send in? Please advise, thank you.

## 2019-06-25 NOTE — Telephone Encounter (Signed)
Unless she is going to stop smoking completely when she gets the patches ( and I mean cold [place] throw any she still has in the trash))  21 mg will most likely be too much ( you have to calculate in the nicotine they continue to absorb as they are continuing to smoke ,hopefully less, but most likely she will still be smoking) Send in the 14 mg patches. We can re-evaluate once she has tried them for a month. It would be helpful to know the number of cigarettes she smokes daily while on the 14 mg patch to determine best mg patch for treatment moving forward. Thanks.

## 2019-06-25 NOTE — Telephone Encounter (Signed)
Called and spoke to pt. Informed her we can send in the Nicotine patches, pt is requesting the 21mg  patches. Pt was previously prescribed 7mg  patches in July by Dr. Carlean Purl. Pt states she is smoking 1-2 cigs per hour.   Sarah please advise what strength of Nicotine patches please. Pt ok waiting until 8/21 for response. Thanks.

## 2019-06-26 MED ORDER — NICOTINE 14 MG/24HR TD PT24: 14.0000 mg | MEDICATED_PATCH | Freq: Every day | TRANSDERMAL | 0 refills | Status: DC

## 2019-06-26 NOTE — Telephone Encounter (Signed)
Spoke with pt and advised rx sent to pharmacy. Nothing further is needed.   

## 2019-06-26 NOTE — Telephone Encounter (Signed)
No updates as of yet.

## 2019-06-26 NOTE — Telephone Encounter (Signed)
14 mg should be fine

## 2019-07-15 ENCOUNTER — Telehealth: Payer: Self-pay | Admitting: *Deleted

## 2019-07-15 NOTE — Telephone Encounter (Signed)
Okay for Prozac 20 mg daily with refill through 05/24/2020

## 2019-07-15 NOTE — Telephone Encounter (Signed)
Patient called stating the Effexor 37.5 mg tablet is not working doesn't make her any feel better, would like to start back on Prozac. Please advise

## 2019-07-16 MED ORDER — FLUOXETINE HCL 20 MG PO CAPS: 20.0000 mg | ORAL_CAPSULE | Freq: Every day | ORAL | 10 refills | Status: DC

## 2019-07-16 NOTE — Telephone Encounter (Signed)
Rx sent, left message Rx sent.

## 2019-07-27 ENCOUNTER — Encounter: Payer: Self-pay | Admitting: Gastroenterology

## 2019-07-29 ENCOUNTER — Encounter: Payer: Self-pay | Admitting: Gynecology

## 2019-08-05 ENCOUNTER — Telehealth: Payer: Self-pay

## 2019-08-05 ENCOUNTER — Emergency Department (HOSPITAL_COMMUNITY): Payer: Commercial Managed Care - PPO

## 2019-08-05 ENCOUNTER — Other Ambulatory Visit: Payer: Self-pay

## 2019-08-05 ENCOUNTER — Encounter (HOSPITAL_COMMUNITY): Payer: Self-pay | Admitting: *Deleted

## 2019-08-05 ENCOUNTER — Emergency Department (HOSPITAL_COMMUNITY)
Admission: EM | Admit: 2019-08-05 | Discharge: 2019-08-05 | Disposition: A | Discharge status: Home / Self Care | Payer: Commercial Managed Care - PPO | Attending: Emergency Medicine | Admitting: Emergency Medicine

## 2019-08-05 DIAGNOSIS — J449 Chronic obstructive pulmonary disease, unspecified: Secondary | ICD-10-CM | POA: Diagnosis not present

## 2019-08-05 DIAGNOSIS — F17210 Nicotine dependence, cigarettes, uncomplicated: Secondary | ICD-10-CM | POA: Insufficient documentation

## 2019-08-05 DIAGNOSIS — R0789 Other chest pain: Secondary | ICD-10-CM

## 2019-08-05 DIAGNOSIS — Z79899 Other long term (current) drug therapy: Secondary | ICD-10-CM | POA: Insufficient documentation

## 2019-08-05 LAB — CBC
HCT: 42.6 % (ref 36.0–46.0)
Hemoglobin: 14.3 g/dL (ref 12.0–15.0)
MCH: 31 pg (ref 26.0–34.0)
MCHC: 33.6 g/dL (ref 30.0–36.0)
MCV: 92.2 fL (ref 80.0–100.0)
Platelets: 324 10*3/uL (ref 150–400)
RBC: 4.62 MIL/uL (ref 3.87–5.11)
RDW: 12.6 % (ref 11.5–15.5)
WBC: 12.3 10*3/uL — ABNORMAL HIGH (ref 4.0–10.5)
nRBC: 0 % (ref 0.0–0.2)

## 2019-08-05 LAB — TROPONIN I (HIGH SENSITIVITY)
Troponin I (High Sensitivity): <2 ng/L (ref <18)
Troponin I (High Sensitivity): <2 ng/L (ref <18)

## 2019-08-05 LAB — BASIC METABOLIC PANEL
Anion gap: 11 (ref 5–15)
BUN: 5 mg/dL — ABNORMAL LOW (ref 6–20)
CO2: 26 mmol/L (ref 22–32)
Calcium: 9.4 mg/dL (ref 8.9–10.3)
Chloride: 107 mmol/L (ref 98–111)
Creatinine, Ser: 0.88 mg/dL (ref 0.44–1.00)
GFR calc Af Amer: >60 mL/min (ref >60)
GFR calc non Af Amer: >60 mL/min (ref >60)
Glucose, Bld: 103 mg/dL — ABNORMAL HIGH (ref 70–99)
Potassium: 4.1 mmol/L (ref 3.5–5.1)
Sodium: 144 mmol/L (ref 135–145)

## 2019-08-05 MED ORDER — ALBUTEROL SULFATE HFA 108 (90 BASE) MCG/ACT IN AERS
2.0000 | INHALATION_SPRAY | Freq: Four times a day (QID) | RESPIRATORY_TRACT | Status: DC
  Administered 2019-08-05: 2 via RESPIRATORY_TRACT
  Filled 2019-08-05: qty 6.7

## 2019-08-05 MED ORDER — SODIUM CHLORIDE 0.9% FLUSH: 3.0000 mL | Freq: Once | INTRAVENOUS | Status: DC

## 2019-08-05 MED ORDER — PREDNISONE 20 MG PO TABS: 40.0000 mg | ORAL_TABLET | Freq: Every day | ORAL | 0 refills | Status: DC

## 2019-08-05 MED ORDER — METHYLPREDNISOLONE SODIUM SUCC 125 MG IJ SOLR
125.0000 mg | INTRAMUSCULAR | Status: AC
  Administered 2019-08-05: 22:00:00 125 mg via INTRAVENOUS
  Filled 2019-08-05: qty 2

## 2019-08-05 NOTE — ED Notes (Signed)
Patient verbalizes understanding of discharge instructions. Opportunity for questioning and answers were provided. Armband removed by staff, pt discharged from ED.  

## 2019-08-05 NOTE — Telephone Encounter (Signed)
Pt said she feels really bad; pt having SOB with difficulty getting her breathing; feels like indigestion and having mid chest pain with dull throbbing pain; pt having difficulty concentrating; H/A on and off, pt also has burning sensation in stomach; pepto bismol not helping. Pt has non prod cough, shoulders and back pain,N&V last night;pt has had slight muscle pain for months; pt having diarrhea for 1 wk. Pt also has weakness in arms and legs with difficulty walking. Pt tested positive for covid in 05/2019. Pt saw cardiologist few months ago. Pt has pulmonologist but has not seen recently. Pt has not traveled and no known exposure to + covid pt. Pt will go to [place].pt declined 911. FYI to Glenda Chroman FNP.

## 2019-08-05 NOTE — ED Provider Notes (Signed)
[facility] DEPART[facility] Provider Note   CSN: [id number] Arrival date & time: 08/05/19  1503     History   Chief Complaint Chief Complaint  Patient presents with  . Chest Pain    HPI Sophia Vance is a 53 y.o. female.     HPI Patient presents after recent cessation of smoking, now with concern for cough, chest tightness. Patient has history of COPD, no history of cardiac disease. Over the last week or so she notes that she has had chest tightness, worse with sitting upright, better with lying supine There is possibly slight improvement with Pepto-Bismol as well. She has some exertional fatigue, but no exertional chest pain. No fever, no vomiting, diarrhea, no confusion. Notably, the patient had a novel coronavirus infection earlier in the year. Past Medical History:  Diagnosis Date  . Anxiety   . Chest pain   . COPD (chronic obstructive pulmonary disease) [facility])     Patient Active Problem List   Diagnosis Date Noted  . Sinusitis 12/28/2010  . CHRONIC OBSTRUCTIVE PULMONARY DISEASE 09/21/2010  . TOBACCO ABUSE 09/07/2010  . DEPRESSION 09/07/2010  . Dyspnea on exertion 09/07/2010  . CHEST PAIN, ATYPICAL, HX OF 09/07/2010    Past Surgical History:  Procedure Laterality Date  . APPENDECTOMY    . BREAST SURGERY     Biopsy neg.     OB History    Gravida  1   Para  1   Term      Preterm      AB      Living  1     SAB      TAB      Ectopic      Multiple      Live Births               Home Medications    Prior to Admission medications   Medication Sig Start Date End Date Taking? Authorizing Provider  albuterol (VENTOLIN HFA) 108 (90 Base) MCG/ACT inhaler Inhale 1-2 puffs into the lungs every 6 (six) hours as needed for wheezing or shortness of breath. 05/27/19   Elby Beck, FNP  FLUoxetine (PROZAC) 20 MG capsule Take 1 capsule (20 mg total) by mouth daily. 07/16/19   Fontaine, Belinda Block, MD  ibuprofen  (ADVIL,MOTRIN) 200 MG tablet Take 800 mg by mouth every 6 (six) hours as needed for headache (pain).    [provider]  nicotine (NICODERM CQ - DOSED IN MG/24 HOURS) 14 mg/24hr patch Place 1 patch (14 mg total) onto the skin daily. 06/26/19   Magdalen Spatz, NP  nicotine (NICODERM CQ - DOSED IN MG/24 HR) 7 mg/24hr patch Place 1 patch (7 mg total) onto the skin daily. 05/27/19   Elby Beck, FNP  Tiotropium Bromide Monohydrate (SPIRIVA RESPIMAT) 1.25 MCG/ACT AERS Inhale 2 puffs into the lungs daily. 06/18/19   Garner Nash, DO    Family History Family History  Problem Relation Age of Onset  . Osteoarthritis Mother   . Heart failure Mother   . Hypertension Mother   . COPD Mother   . Breast cancer Mother   . Cancer Father        Throat, lung, brain  . Heart attack Maternal Grandfather   . Kidney failure Maternal Grandmother   . Breast cancer Paternal Grandmother     Social History Social History   Tobacco Use  . Smoking status: Current Every Day Smoker    Packs/day: 0.25  Years: 35.00    Pack years: 8.75    Types: Cigarettes  . Smokeless tobacco: Never Used  . Tobacco comment: 3 cigs per day (4.24.17)  Substance Use Topics  . Alcohol use: Yes    Alcohol/week: 0.0 standard drinks    Comment: Rare  . Drug use: No     Allergies   Patient has no known allergies.   Review of Systems Review of Systems  Constitutional:       Per HPI, otherwise negative  HENT:       Per HPI, otherwise negative  Respiratory:       Per HPI, otherwise negative  Cardiovascular:       Per HPI, otherwise negative  Gastrointestinal: Negative for vomiting.  Endocrine:       Negative aside from HPI  Genitourinary:       Neg aside from HPI   Musculoskeletal:       Per HPI, otherwise negative  Skin: Negative.   Neurological: Negative for syncope.     Physical Exam Updated Vital Signs BP 125/88   Pulse 78   Temp 98.7 F (37.1 C) (Oral)   Resp 16   Ht 5\' 4"  (1.626  m)   Wt 71.7 kg   SpO2 100%   BMI 27.12 kg/m   Physical Exam Vitals signs and nursing note reviewed.  Constitutional:      General: She is not in acute distress.    Appearance: She is well-developed.  HENT:     Head: Normocephalic and atraumatic.  Eyes:     Conjunctiva/sclera: Conjunctivae normal.  Cardiovascular:     Rate and Rhythm: Normal rate and regular rhythm.  Pulmonary:     Effort: Tachypnea present.     Breath sounds: Decreased breath sounds present. No wheezing.  Abdominal:     General: There is no distension.  Skin:    General: Skin is warm and dry.  Neurological:     Mental Status: She is alert and oriented to person, place, and time.     Cranial Nerves: No cranial nerve deficit.      ED Treatments / Results  Labs (all labs ordered are listed, but only abnormal results are displayed) Labs Reviewed  BASIC METABOLIC PANEL - Abnormal; Notable for the following components:      Result Value   Glucose, Bld 103 (*)    BUN 5 (*)    All other components within normal limits  CBC - Abnormal; Notable for the following components:   WBC 12.3 (*)    All other components within normal limits  TROPONIN I (HIGH SENSITIVITY)  TROPONIN I (HIGH SENSITIVITY)    EKG EKG Interpretation  Date/Time:  Wednesday August 05 2019 15:53:58 EDT Ventricular Rate:  89 PR Interval:  114 QRS Duration: 82 QT Interval:  348 QTC Calculation: 423 R Axis:   -16 Text Interpretation:  Normal sinus rhythm Normal ECG Confirmed by Carmin Muskrat [phone]) on 08/05/2019 8:21:08 PM   Radiology Dg Chest 2 View  Result Date: 08/05/2019 CLINICAL DATA:  Chest pain and burning with shortness of breath. EXAM: CHEST - 2 VIEW COMPARISON:  05/16/2019 FINDINGS: The heart size and mediastinal contours are within normal limits. Both lungs are clear. The visualized skeletal structures are unremarkable. IMPRESSION: No active cardiopulmonary disease. Electronically Signed   By: Kerby Moors M.D.    On: 08/05/2019 16:19    Procedures Procedures (including critical care time)  Medications Ordered in ED Medications  sodium chloride flush (NS) 0.9 %  injection 3 mL (3 mLs Intravenous Not Given 08/05/19 2030)     Initial Impression / Assessment and Plan / ED Course  I have reviewed the triage vital signs and the nursing notes.  Pertinent labs & imaging results that were available during my care of the patient were reviewed by me and considered in my medical decision making (see chart for details).  This adult female presents with recent cessation of smoking, and recent coronavirus infection, now with dyspnea, chest tightness. She is awake, alert, with a nonischemic EKG, no hypoxia, tachypnea, though there is some suspicion for mild bronchitis plus minus changes associated with smoking cessation. With no distress, no evidence for bacteremia, sepsis, no evidence for PE, no evidence for dissection, patient is appropriate for discharge after addition of steroids, albuterol, with outpatient follow-up.  Final Clinical Impressions(s) / ED Diagnoses  Atypical chest pain   Carmin Muskrat, MD 08/05/19 2052

## 2019-08-05 NOTE — ED Triage Notes (Signed)
The pt is c/o chest pain and sob for 3-4 days  No temp non-productive cough  Is a smoker she just quit 2-3 days ago hx copd  The pt was pos for covid July 11th  She has not been around anyone with covid since then

## 2019-08-05 NOTE — Discharge Instructions (Signed)
As discussed, today's evaluation has been generally reassuring. Your shortness of breath and chest pain are likely due to comminution of bronchitis and your recent cessation of cigarette smoking. Please use the provided albuterol every 4 hours for the next 2 days, then as needed. In addition, please obtain the prescribed steroids and take them for the next 4 days.  Return here for concerning changes otherwise please follow-up with your physician.

## 2019-08-06 ENCOUNTER — Telehealth: Payer: Self-pay | Admitting: Family Medicine

## 2019-08-06 NOTE — Telephone Encounter (Signed)
Pt is needing an ED Follow up within the next week with Debbie.  Pt discharged from [facility] on 08/05/2019 

## 2019-08-06 NOTE — Telephone Encounter (Signed)
Noted. Patient seen in ER per EMR review.

## 2019-08-10 NOTE — Telephone Encounter (Signed)
Pt left v/m about cb about seeing GI. I called pt back and notified as instructed by Glenda Chroman FNP. Pt voiced understanding and pt will call GI about appt; pt is already scheduled for colonoscopy on 09/16/19. Pt does not have problem swallowing but when swallows food pt feels like the food gets stuck in chest and sometimes food will come back up. Pt said she is hungry all the time. Pt said since already talking with GI she will call them about these symptoms and getting endoscopy scheduled. If pt has further problem she will call Glenda Chroman FNP back. FYI to Glenda Chroman FNP.

## 2019-08-10 NOTE — Telephone Encounter (Signed)
Pt states that went to ED d/t some chest pain - diagnosed with bronchitis. Given inhaler and medications to treat. Pt states that she is having no improvement. Her chest is still on fire. Pt states that she is having trouble swallowing - not able to eat a full meal.   Pt is Declining ED Follow up at this time. Does not understand why she needs a follow up with our office since she was treated at the ED last week.   Pt is requesting an Endoscopy when she has a Colonoscopy in November (scheduled)-- wants to move her appt up ASAP rather than wait until November.   She started taking Pepcid for her sx's and that does not seem to be working.   Please advise Debbie.

## 2019-08-10 NOTE — Telephone Encounter (Signed)
Noted  

## 2019-08-10 NOTE — Telephone Encounter (Signed)
Please call patient and tell her that if she is having trouble swallowing she needs to see GI. She can call them and request an appointment or I can place a new referral. Please let me know which she prefers.

## 2019-08-18 ENCOUNTER — Ambulatory Visit (INDEPENDENT_AMBULATORY_CARE_PROVIDER_SITE_OTHER): Payer: Commercial Managed Care - PPO | Admitting: Adult Health

## 2019-08-18 ENCOUNTER — Other Ambulatory Visit: Payer: Self-pay

## 2019-08-18 ENCOUNTER — Encounter: Payer: Self-pay | Admitting: Adult Health

## 2019-08-18 ENCOUNTER — Other Ambulatory Visit (INDEPENDENT_AMBULATORY_CARE_PROVIDER_SITE_OTHER): Payer: Commercial Managed Care - PPO

## 2019-08-18 DIAGNOSIS — Z8616 Personal history of COVID-19: Secondary | ICD-10-CM

## 2019-08-18 DIAGNOSIS — Z8619 Personal history of other infectious and parasitic diseases: Secondary | ICD-10-CM

## 2019-08-18 DIAGNOSIS — J449 Chronic obstructive pulmonary disease, unspecified: Secondary | ICD-10-CM

## 2019-08-18 DIAGNOSIS — F17200 Nicotine dependence, unspecified, uncomplicated: Secondary | ICD-10-CM

## 2019-08-18 DIAGNOSIS — R0602 Shortness of breath: Secondary | ICD-10-CM

## 2019-08-18 DIAGNOSIS — K219 Gastro-esophageal reflux disease without esophagitis: Secondary | ICD-10-CM

## 2019-08-18 LAB — D-DIMER, QUANTITATIVE: D-Dimer, Quant: <0.19 mcg/mL FEU (ref <0.50)

## 2019-08-18 LAB — BRAIN NATRIURETIC PEPTIDE: Pro B Natriuretic peptide (BNP): 10 pg/mL (ref 0.0–100.0)

## 2019-08-18 MED ORDER — BUDESONIDE-FORMOTEROL FUMARATE 80-4.5 MCG/ACT IN AERO: 2.0000 | INHALATION_SPRAY | Freq: Two times a day (BID) | RESPIRATORY_TRACT | 0 refills | Status: DC

## 2019-08-18 MED ORDER — BUDESONIDE-FORMOTEROL FUMARATE 80-4.5 MCG/ACT IN AERO: 2.0000 | INHALATION_SPRAY | Freq: Two times a day (BID) | RESPIRATORY_TRACT | 5 refills | Status: DC

## 2019-08-18 NOTE — Patient Instructions (Addendum)
Begin Symbicort 80 2 puffs Twice daily  , rinse after use Use Albuterol Inhaler 2 puffs every 4hrs as needed wheezing /shortness of breath  Labs today  Begin Prilosec 20mg  daily before meal  Take Pepcid 20mg  At bedtime   GERD diet .  Follow up with Dr. Valeta Harms in 6 weeks with PFT and 6 min walk  Please contact office for sooner follow up if symptoms do not improve or worsen or seek emergency care

## 2019-08-18 NOTE — Progress Notes (Signed)
Virtual Visit via Telephone Note  I connected with Sophia Vance on 08/18/19 at 10:00 AM EDT by telephone and verified that I am speaking with the correct person using two identifiers.  Location: Patient: Home  Provider: Office    I discussed the limitations, risks, security and privacy concerns of performing an evaluation and management service by telephone and the availability of in person appointments. I also discussed with the patient that there may be a patient responsible charge related to this service. The patient expressed understanding and agreed to proceed.   History of Present Illness: 53 year old female active smoker seen for pulmonary consult June 18, 2019 for shortness of breath and cough and COPD (dx 2017) .  Diagnosed with COVID-19 virus July 11 (symptomatic with short-term fever cough fatigue loss of taste and smell) Today's televisit as an acute office visit for persistent shortness of breath. Patient complains that she has persistent shortness of breath .  She has minimum cough or wheezing.  Takes albuterol on occasion with minimum improvement.  Was given Spiriva in August without any significant perceived benefits.  Shortness of breath seems to come and go.  Sometimes it is worse when she sitting still.  Sometimes it is better when she is more active.  She has also been dealing with a lot of reflux.  Is been taking Pepcid without any relief.  She was seen in the emergency room a few weeks ago.  Troponin was negative.  Chest x-ray was unremarkable.  Patient does continue to smoke.  Smoking cessation was encouraged.  She was given a course of steroids during the emergency room visit.  Did not notice any significant change in symptoms.  She denies any chest pain, calf pain, leg swelling, orthopnea.  Appetite is good.  She has no longer been taking Spiriva.  Previous pulmonary function testing showed mild airflow obstruction with reversibility.  Observations/Objective: PFTs April  2017 FEV1 83%, ratio 67, FVC 99% (prebronchodilator), postbronchodilator FEV1 92%, ratio 70, FVC 106%, 11% bronchodilator change, mid flow reversibility, DLCO 78%  Recent Labs  Lab 08/18/19 1220  PROBNP 10.0   D-dimer less than 0.19  Assessment and Plan: Mild COPD and active smoker.  She may have a component of asthma. Smoking cessation is key.  Patient continues to have ongoing shortness of breath.  She did have positive COVID-19 testing couple months ago.   Labs today with a BNP and D-dimer were completed.  Lab results show a normal BNP and D-dimer.  So low suspicion for underlying for VTE or CHF. During active symptoms in the emergency room couple weeks ago troponins were negative. Patient will need repeat PFTs and a 6-minute walk test to evaluate for possible progression of her COPD and possible exertional hypoxemia.  We will treat for underlying reflux.  Begin ICS/LABA to see if this helps with her symptoms.  Plan  Patient Instructions  Begin Symbicort 80 2 puffs Twice daily  , rinse after use Use Albuterol Inhaler 2 puffs every 4hrs as needed wheezing /shortness of breath  Labs today  Begin Prilosec 20mg  daily before meal  Take Pepcid 20mg  At bedtime   GERD diet .  Follow up with Dr. Valeta Harms in 6 weeks with PFT and 6 min walk  Please contact office for sooner follow up if symptoms do not improve or worsen or seek emergency care        Follow Up Instructions: Follow-up in 6 weeks with PFT and 6-minute walk  Please contact office for sooner  follow up if symptoms do not improve or worsen or seek emergency care     I discussed the assessment and treatment plan with the patient. The patient was provided an opportunity to ask questions and all were answered. The patient agreed with the plan and demonstrated an understanding of the instructions.   The patient was advised to call back or seek an in-person evaluation if the symptoms worsen or if the condition fails to improve as  anticipated.  I provided 25  minutes of non-face-to-face time during this encounter.   [name], NP

## 2019-08-19 ENCOUNTER — Encounter: Payer: Self-pay | Admitting: Physician Assistant

## 2019-08-19 ENCOUNTER — Ambulatory Visit (INDEPENDENT_AMBULATORY_CARE_PROVIDER_SITE_OTHER): Payer: Commercial Managed Care - PPO | Admitting: Physician Assistant

## 2019-08-19 DIAGNOSIS — K219 Gastro-esophageal reflux disease without esophagitis: Secondary | ICD-10-CM | POA: Diagnosis not present

## 2019-08-19 MED ORDER — OMEPRAZOLE 40 MG PO CPDR: 40.0000 mg | DELAYED_RELEASE_CAPSULE | Freq: Every day | ORAL | 11 refills | Status: DC

## 2019-08-19 NOTE — Progress Notes (Signed)
Pt notified results D-Dimer/BNP normal Nothing further needed.

## 2019-08-19 NOTE — Patient Instructions (Addendum)
Please follow your anti-reflux regimen and diet. We have given you this handout to look over.  You have been scheduled for an endoscopy and colonoscopy. Please follow the written instructions given to you at your visit today. Please pick up your prep supplies at the pharmacy within the next 1-3 days. If you use inhalers (even only as needed), please bring them with you on the day of your procedure. Your physician has requested that you go to www.startemmi.com and enter the access code given to you at your visit today. This web site gives a general overview about your procedure. However, you should still follow specific instructions given to you by our office regarding your preparation for the procedure.  We have sent the following medications to your pharmacy for you to pick up at your convenience: Omeprazole 40 mg daily  Continue Pepcid OTC every evening.  If you are age 57 or older, your body mass index should be between 23-30. Your Body mass index is 27.46 kg/m. If this is out of the aforementioned range listed, please consider follow up with your Primary Care Provider.  If you are age 83 or younger, your body mass index should be between 19-25. Your Body mass index is 27.46 kg/m. If this is out of the aformentioned range listed, please consider follow up with your Primary Care Provider.

## 2019-08-19 NOTE — Progress Notes (Signed)
Subjective:    Patient ID: Sophia Vance, female    DOB: 10/09/1966, 53 y.o.   MRN: [id number]  HPI Sophia Vance is a pleasant 53 year old white female, new to GI today referred by Tor Netters, NP for colon cancer screening and also to discuss EGD. States she may have had procedures 30 years ago, but nothing more recent. She has history of COPD, is a current smoker, recent new diagnosis of asthma and was diagnosed with COVID-19 in July.  She says she has had increased issues with dyspnea since then.  She was seen by pulmonary yesterday. She denies any lower abdominal symptoms, no melena or hematochezia.  Family history is negative for colon cancer as far she is aware. He has been having a lot of issues with GERD recently with heartburn, indigestion and over the past month or so has having solid food and some liquid dysphagia.  She says she will eat something, feels it lodge in the subxiphoid area and is frequently having to regurgitate.  Appetite has been very good, she has actually gained weight recently, no nausea or vomiting.  She has adjusted her diet, has stopped drinking soft drinks though admits she still consumes a lot of caffeine without any improvement in symptoms. She was just started on omeprazole 20 mg OTC yesterday and Pepcid 20 mg every afternoon after visit with pulmonary NP.  Review of Systems Pertinent positive and negative review of systems were noted in the above HPI section.  All other review of systems was otherwise negative.  Outpatient Encounter Medications as of 08/19/2019  Medication Sig  . albuterol (VENTOLIN HFA) 108 (90 Base) MCG/ACT inhaler Inhale 1-2 puffs into the lungs every 6 (six) hours as needed for wheezing or shortness of breath.  Marland Kitchen aspirin EC 81 MG tablet Take 81 mg by mouth daily.  . budesonide-formoterol (SYMBICORT) 80-4.5 MCG/ACT inhaler Inhale 2 puffs into the lungs 2 (two) times daily.  . cholecalciferol (VITAMIN D3) 25 MCG (1000 UT) tablet Take 1,000 Units  by mouth daily.  . diphenhydrAMINE (BENADRYL) 25 MG tablet Take 25 mg by mouth 2 (two) times daily as needed for allergies.  Marland Kitchen FLUoxetine (PROZAC) 20 MG capsule Take 1 capsule (20 mg total) by mouth daily.  Marland Kitchen ibuprofen (ADVIL,MOTRIN) 200 MG tablet Take 800 mg by mouth every 6 (six) hours as needed for headache (pain).  . nicotine (NICODERM CQ - DOSED IN MG/24 HOURS) 14 mg/24hr patch Place 1 patch (14 mg total) onto the skin daily.  . Simethicone (GAS RELIEF 125 MAX ST PO) Take 1 tablet by mouth daily.  Marland Kitchen omeprazole (PRILOSEC) 40 MG capsule Take 1 capsule (40 mg total) by mouth daily before breakfast.  . [DISCONTINUED] budesonide-formoterol (SYMBICORT) 80-4.5 MCG/ACT inhaler Inhale 2 puffs into the lungs 2 (two) times daily.  . [DISCONTINUED] Tiotropium Bromide Monohydrate (SPIRIVA RESPIMAT) 1.25 MCG/ACT AERS Inhale 2 puffs into the lungs daily. (Patient not taking: Reported on 08/18/2019)   No facility-administered encounter medications on file as of 08/19/2019.    No Known Allergies Patient Active Problem List   Diagnosis Date Noted  . GERD (gastroesophageal reflux disease) 08/19/2019  . Sinusitis 12/28/2010  . CHRONIC OBSTRUCTIVE PULMONARY DISEASE 09/21/2010  . TOBACCO ABUSE 09/07/2010  . DEPRESSION 09/07/2010  . Dyspnea on exertion 09/07/2010  . CHEST PAIN, ATYPICAL, HX OF 09/07/2010   Social History   Socioeconomic History  . Marital status: Married    Spouse name: Not on file  . Number of children: Not on file  .  Years of education: Not on file  . Highest education level: Not on file  Occupational History  . Not on file  Social Needs  . Financial resource strain: Not on file  . Food insecurity    Worry: Not on file    Inability: Not on file  . Transportation needs    Medical: Not on file    Non-medical: Not on file  Tobacco Use  . Smoking status: Current Every Day Smoker    Packs/day: 0.25    Years: 35.00    Pack years: 8.75    Types: Cigarettes  . Smokeless  tobacco: Never Used  Substance and Sexual Activity  . Alcohol use: Yes    Alcohol/week: 0.0 standard drinks    Comment: Rare  . Drug use: No  . Sexual activity: Not Currently    Birth control/protection: None    Comment: 1st intercourse 35 yo-5 partners  Lifestyle  . Physical activity    Days per week: Not on file    Minutes per session: Not on file  . Stress: Not on file  Relationships  . Social Herbalist on phone: Not on file    Gets together: Not on file    Attends religious service: Not on file    Active member of club or organization: Not on file    Attends meetings of clubs or organizations: Not on file    Relationship status: Not on file  . Intimate partner violence    Fear of current or ex partner: Not on file    Emotionally abused: Not on file    Physically abused: Not on file    Forced sexual activity: Not on file  Other Topics Concern  . Not on file  Social History Narrative   Married   1 child - daughter   Lives with spouse   Customer Service    Ms. Riehl's family history includes Breast cancer in her mother and paternal grandmother; COPD in her mother; Cancer in her father; Heart attack in her maternal grandfather; Heart failure in her mother; Hypertension in her mother; Kidney failure in her maternal grandmother; Osteoarthritis in her mother.      Objective:    Vitals:   08/19/19 1523  BP: 122/60  Pulse: 85  SpO2: 98%    Physical Exam Well-developed well-nourished white female in no acute distress.  Height, Weight 160, BMI 27.4  HEENT; nontraumatic normocephalic, EOMI, PER R LA, sclera anicteric. Oropharynx; not examined/mask/Covid Neck; supple, no JVD Cardiovascular; regular rate and rhythm with S1-S2, no murmur rub or gallop Pulmonary; Clear bilaterally Abdomen; soft, nontender, nondistended, no palpable mass or hepatosplenomegaly, bowel sounds are active Rectal; not done today Skin; benign exam, no jaundice rash or appreciable  lesions Extremities; no clubbing cyanosis or edema skin warm and dry Neuro/Psych; alert and oriented x4, grossly nonfocal mood and affect appropriate       Assessment & Plan:   #65 54 year old white female with history of GERD, now with 1 month history of ongoing daily symptoms with heartburn indigestion and new onset of dysphagia with episodes requiring regurgitation. Suspect peptic stricture, in setting of chronic GERD. Rule out malignancy  #2 colon cancer screening-asymptomatic-no prior colonoscopy #3 COPD no O2 use #4 asthma #5.  Persistent dyspnea-status post COVID-24 May 2019-undergoing evaluation with pulmonary  Plan; Strict antireflux regimen Patient was provided with antireflux diet and antireflux precautions. We will start omeprazole 40 mg p.o. every morning AC breakfast, prescription sent For now we  will continue Pepcid 20 mg q. p.m. Patient will be scheduled for upper endoscopy with probable esophageal dilation, and colonoscopy with Dr. Loletha Carrow.  Both procedures were discussed in detail with the patient including indications risks and benefits and she is agreeable to proceed.  Amy Genia Harold PA-C 08/19/2019   Cc: Elby Beck, FNP

## 2019-08-21 ENCOUNTER — Other Ambulatory Visit: Payer: Self-pay | Admitting: Family Medicine

## 2019-08-21 ENCOUNTER — Telehealth: Payer: Self-pay | Admitting: Physician Assistant

## 2019-08-21 DIAGNOSIS — J449 Chronic obstructive pulmonary disease, unspecified: Secondary | ICD-10-CM

## 2019-08-21 MED ORDER — NA SULFATE-K SULFATE-MG SULF 17.5-3.13-1.6 GM/177ML PO SOLN: 1.0000 | Freq: Once | ORAL | 0 refills | Status: AC

## 2019-08-21 NOTE — Telephone Encounter (Signed)
Pt is requesting suprep be sent to [facility] in [place].

## 2019-08-24 ENCOUNTER — Telehealth: Payer: Self-pay

## 2019-08-24 NOTE — Telephone Encounter (Signed)
Covid-19 screening questions   Do you now or have you had a fever in the last 14 days?  Do you have any respiratory symptoms of shortness of breath or cough now or in the last 14 days?  Do you have any family members or close contacts with diagnosed or suspected Covid-19 in the past 14 days?  Have you been tested for Covid-19 and found to be positive?       

## 2019-08-24 NOTE — Telephone Encounter (Signed)
Spoke to patient. She states she does not have any covid related symptoms and has been fine since. She's actually been to our office since.

## 2019-08-24 NOTE — Telephone Encounter (Signed)
Pt reported that she was tested positive for COVID-19 May 16, 2019.

## 2019-08-25 ENCOUNTER — Ambulatory Visit (AMBULATORY_SURGERY_CENTER): Payer: Commercial Managed Care - PPO | Admitting: Gastroenterology

## 2019-08-25 ENCOUNTER — Other Ambulatory Visit: Payer: Self-pay

## 2019-08-25 ENCOUNTER — Encounter: Payer: Self-pay | Admitting: Gastroenterology

## 2019-08-25 VITALS — BP 112/68 | HR 87 | Temp 98.1°F | Resp 23 | Ht 64.0 in | Wt 160.0 lb

## 2019-08-25 DIAGNOSIS — D122 Benign neoplasm of ascending colon: Secondary | ICD-10-CM

## 2019-08-25 DIAGNOSIS — R1319 Other dysphagia: Secondary | ICD-10-CM

## 2019-08-25 DIAGNOSIS — Z1211 Encounter for screening for malignant neoplasm of colon: Secondary | ICD-10-CM

## 2019-08-25 DIAGNOSIS — R1310 Dysphagia, unspecified: Secondary | ICD-10-CM

## 2019-08-25 DIAGNOSIS — K219 Gastro-esophageal reflux disease without esophagitis: Secondary | ICD-10-CM | POA: Diagnosis not present

## 2019-08-25 MED ORDER — SODIUM CHLORIDE 0.9 % IV SOLN: 500.0000 mL | Freq: Once | INTRAVENOUS | Status: DC

## 2019-08-25 NOTE — Op Note (Signed)
[place] Patient Name: Sophia Vance Procedure Date: 08/25/2019 4:30 PM MRN: [id number] Endoscopist: Mallie Mussel L. Loletha Carrow , MD Age: 53 Referring MD:  Date of Birth: 01-Sep-1966 Gender: Female Account #: [id number] Procedure:                Colonoscopy Indications:              Screening for colorectal malignant neoplasm, This                            is the patient's first colonoscopy Medicines:                Monitored Anesthesia Care Procedure:                Pre-Anesthesia Assessment:                           - Prior to the procedure, a History and Physical                            was performed, and patient medications and                            allergies were reviewed. The patient's tolerance of                            previous anesthesia was also reviewed. The risks                            and benefits of the procedure and the sedation                            options and risks were discussed with the patient.                            All questions were answered, and informed consent                            was obtained. Prior Anticoagulants: The patient has                            taken no previous anticoagulant or antiplatelet                            agents. ASA Grade Assessment: II - A patient with                            mild systemic disease. After reviewing the risks                            and benefits, the patient was deemed in                            satisfactory condition to undergo the procedure.  After obtaining informed consent, the colonoscope                            was passed under direct vision. Throughout the                            procedure, the patient's blood pressure, pulse, and                            oxygen saturations were monitored continuously. The                            Colonoscope was introduced through the anus and                            advanced to the the cecum,  identified by                            appendiceal orifice and ileocecal valve. The                            colonoscopy was performed without difficulty. The                            patient tolerated the procedure well. The quality                            of the bowel preparation was good. The ileocecal                            valve, appendiceal orifice, and rectum were                            photographed. Scope In: 4:40:02 PM Scope Out: 4:55:54 PM Scope Withdrawal Time: 0 hours 9 minutes 34 seconds  Total Procedure Duration: 0 hours 15 minutes 52 seconds  Findings:                 The perianal and digital rectal examinations were                            normal.                           A diminutive polyp was found in the proximal                            ascending colon. The polyp was sessile. The polyp                            was removed with a cold snare. Resection and                            retrieval were complete.  The exam was otherwise without abnormality on                            direct and retroflexion views. Complications:            No immediate complications. Estimated Blood Loss:     Estimated blood loss was minimal. Impression:               - One diminutive polyp in the proximal ascending                            colon, removed with a cold snare. Resected and                            retrieved.                           - The examination was otherwise normal on direct                            and retroflexion views. Recommendation:           - Patient has a contact number available for                            emergencies. The signs and symptoms of potential                            delayed complications were discussed with the                            patient. Return to normal activities tomorrow.                            Written discharge instructions were provided to the                             patient.                           - Resume previous diet.                           - Continue present medications.                           - Await pathology results.                           - Repeat colonoscopy is recommended for                            surveillance. The colonoscopy date will be                            determined after pathology results from today's  exam become available for review.                           - See the other procedure note for documentation of                            additional recommendations. Denham Mose L. Loletha Carrow, MD 08/25/2019 5:07:37 PM This report has been signed electronically.

## 2019-08-25 NOTE — Op Note (Signed)
[place] Patient Name: Sophia Vance Procedure Date: 08/25/2019 4:27 PM MRN: [id number] Endoscopist: Mallie Mussel L. Loletha Carrow , MD Age: 53 Referring MD:  Date of Birth: 1966/06/19 Gender: Female Account #: [id number] Procedure:                Upper GI endoscopy Indications:              Esophageal dysphagia, Esophageal reflux symptoms                            that persist despite appropriate therapy (recent                            increase omeprazole from 20 mg to 40 mg once daily) Medicines:                Monitored Anesthesia Care Procedure:                Pre-Anesthesia Assessment:                           - Prior to the procedure, a History and Physical                            was performed, and patient medications and                            allergies were reviewed. The patient's tolerance of                            previous anesthesia was also reviewed. The risks                            and benefits of the procedure and the sedation                            options and risks were discussed with the patient.                            All questions were answered, and informed consent                            was obtained. Prior Anticoagulants: The patient has                            taken no previous anticoagulant or antiplatelet                            agents. ASA Grade Assessment: II - A patient with                            mild systemic disease. After reviewing the risks                            and benefits, the patient was deemed in  satisfactory condition to undergo the procedure.                           After obtaining informed consent, the endoscope was                            passed under direct vision. Throughout the                            procedure, the patient's blood pressure, pulse, and                            oxygen saturations were monitored continuously. The   Endoscope was introduced through the mouth, and                            advanced to the second part of duodenum. The upper                            GI endoscopy was accomplished without difficulty.                            The patient tolerated the procedure well. Scope In: Scope Out: Findings:                 The esophagus was normal.                           The stomach was normal.                           The cardia and gastric fundus were normal on                            retroflexion.                           The examined duodenum was normal. Complications:            No immediate complications. Estimated Blood Loss:     Estimated blood loss: none. Impression:               - Normal esophagus.                           - Normal stomach.                           - Normal examined duodenum.                           - No specimens collected.                           Reflux-rleated dysmotility, no stricture seen. Recommendation:           - Patient has a contact number available for  emergencies. The signs and symptoms of potential                            delayed complications were discussed with the                            patient. Return to normal activities tomorrow.                            Written discharge instructions were provided to the                            patient.                           - Resume previous diet.                           - Continue present medications. If, with diet and                            lifestyle measures, symptoms consistently under                            better control for next 6-8 weeks, then decrease                            omeprazole back to 20 mg once daily with goal of                            weaning off medicine altogether.                           - See the other procedure note for documentation of                            additional recommendations.                            - Stop smoking and decrease caffeine intake.                           - Follow up with me as needed. Clifton Safley L. Loletha Carrow, MD 08/25/2019 5:11:50 PM This report has been signed electronically.

## 2019-08-25 NOTE — Progress Notes (Signed)
Called to room to assist during endoscopic procedure.  Patient ID and intended procedure confirmed with present staff. Received instructions for my participation in the procedure from the performing physician.  

## 2019-08-25 NOTE — Progress Notes (Signed)
Temp check by LC.  Vital check by [place].  Reviewed and verified medical and surgical history with patient.

## 2019-08-25 NOTE — Progress Notes (Signed)
____________________________________________________________  Attending physician addendum:  Thank you for sending this case to me. I have reviewed the entire note, and the outlined plan seems appropriate.  Scheduled for procedures today.  Wilfrid Lund, MD  ____________________________________________________________

## 2019-08-25 NOTE — Patient Instructions (Signed)
YOU HAD AN ENDOSCOPIC PROCEDURE TODAY AT THE [facility]:   Refer to the procedure report that was given to you for any specific questions about what was found during the examination.  If the procedure report does not answer your questions, please call your gastroenterologist to clarify.  If you requested that your care partner not be given the details of your procedure findings, then the procedure report has been included in a sealed envelope for you to review at your convenience later.  YOU SHOULD EXPECT: Some feelings of bloating in the abdomen. Passage of more gas than usual.  Walking can help get rid of the air that was put into your GI tract during the procedure and reduce the bloating. If you had a lower endoscopy (such as a colonoscopy or flexible sigmoidoscopy) you may notice spotting of blood in your stool or on the toilet paper. If you underwent a bowel prep for your procedure, you may not have a normal bowel movement for a few days.  Please Note:  You might notice some irritation and congestion in your nose or some drainage.  This is from the oxygen used during your procedure.  There is no need for concern and it should clear up in a day or so.  SYMPTOMS TO REPORT IMMEDIATELY:   Following lower endoscopy (colonoscopy or flexible sigmoidoscopy):  Excessive amounts of blood in the stool  Significant tenderness or worsening of abdominal pains  Swelling of the abdomen that is new, acute  Fever of 100F or higher   Following upper endoscopy (EGD)  Vomiting of blood or coffee ground material  New chest pain or pain under the shoulder blades  Painful or persistently difficult swallowing  New shortness of breath  Fever of 100F or higher  Black, tarry-looking stools  For urgent or emergent issues, a gastroenterologist can be reached at any hour by calling [phone].   DIET:  We do recommend a small meal at first, but then you may proceed to your regular diet.  Drink  plenty of fluids but you should avoid alcoholic beverages for 24 hours.  ACTIVITY:  You should plan to take it easy for the rest of today and you should NOT DRIVE or use heavy machinery until tomorrow (because of the sedation medicines used during the test).    FOLLOW UP: Our staff will call the number listed on your records 48-72 hours following your procedure to check on you and address any questions or concerns that you may have regarding the information given to you following your procedure. If we do not reach you, we will leave a message.  We will attempt to reach you two times.  During this call, we will ask if you have developed any symptoms of COVID 19. If you develop any symptoms (ie: fever, flu-like symptoms, shortness of breath, cough etc.) before then, please call [phone].  If you test positive for Covid 19 in the 2 weeks post procedure, please call and report this information to us.    If any biopsies were taken you will be contacted by phone or by letter within the next 1-3 weeks.  Please call us at [phone] if you have not heard about the biopsies in 3 weeks.    SIGNATURES/CONFIDENTIALITY: You and/or your care partner have signed paperwork which will be entered into your electronic medical record.  These signatures attest to the fact that that the information above on your After Visit Summary has been reviewed and is   understood.  Full responsibility of the confidentiality of this discharge information lies with you and/or your care-partner.     Handouts were given to you on polyps and GERD. If, with diet and lifestyle measures, symptoms consistently under control for next 6-8 weeks, then decrease OMEPRAZOLE back to 20 mg once daily with goal of weaning off medicine altogether. You may resume your other current medications today. STOP smoking and decrease caffeine intake. Follow up with Dr. Loletha Carrow as needed. Await biopsy results. Please call if any questions or  concerns.

## 2019-08-25 NOTE — Progress Notes (Signed)
A/ox3, pleased with MAC, report to RN 

## 2019-08-25 NOTE — Progress Notes (Signed)
No problems noted in the recovery room. maw 

## 2019-08-27 ENCOUNTER — Telehealth: Payer: Self-pay

## 2019-08-27 NOTE — Telephone Encounter (Signed)
  Follow up Call-  Call back number 08/25/2019  Post procedure Call Back phone  # [phone]  Permission to leave phone message Yes  Some recent data might be hidden     Patient questions:  Do you have a fever, pain , or abdominal swelling? No. Pain Score  0 *  Have you tolerated food without any problems? Yes.    Have you been able to return to your normal activities? Yes.    Do you have any questions about your discharge instructions: Diet   No. Medications  No. Follow up visit  No.  Do you have questions or concerns about your Care? No.  Actions: * If pain score is 4 or above: No action needed, pain <4.  1. Have you developed a fever since your procedure? no  2.   Have you had an respiratory symptoms (SOB or cough) since your procedure? no  3.   Have you tested positive for COVID 19 since your procedure no  4.   Have you had any family members/close contacts diagnosed with the COVID 19 since your procedure?  no   If yes to any of these questions please route to Joylene John, RN and Alphonsa Gin, Therapist, sports.

## 2019-09-01 ENCOUNTER — Encounter: Payer: Self-pay | Admitting: Gastroenterology

## 2019-09-30 ENCOUNTER — Encounter: Payer: Commercial Managed Care - PPO | Admitting: Gastroenterology

## 2019-10-09 ENCOUNTER — Other Ambulatory Visit (HOSPITAL_COMMUNITY)
Admission: RE | Admit: 2019-10-09 | Discharge: 2019-10-09 | Disposition: A | Discharge status: Home / Self Care | Payer: Commercial Managed Care - PPO | Source: Ambulatory Visit | Attending: Pulmonary Disease | Admitting: Pulmonary Disease

## 2019-10-09 ENCOUNTER — Other Ambulatory Visit: Payer: Self-pay

## 2019-10-09 DIAGNOSIS — Z20828 Contact with and (suspected) exposure to other viral communicable diseases: Secondary | ICD-10-CM | POA: Insufficient documentation

## 2019-10-09 DIAGNOSIS — Z20822 Contact with and (suspected) exposure to covid-19: Secondary | ICD-10-CM

## 2019-10-09 DIAGNOSIS — Z01812 Encounter for preprocedural laboratory examination: Secondary | ICD-10-CM | POA: Diagnosis present

## 2019-10-09 LAB — SARS CORONAVIRUS 2 (TAT 6-24 HRS): SARS Coronavirus 2: NEGATIVE

## 2019-10-09 NOTE — Addendum Note (Signed)
Addended by: Luisa Dago A on: 10/09/2019 12:20 PM   Modules accepted: Orders

## 2019-10-12 ENCOUNTER — Other Ambulatory Visit: Payer: Self-pay

## 2019-10-12 ENCOUNTER — Ambulatory Visit (INDEPENDENT_AMBULATORY_CARE_PROVIDER_SITE_OTHER): Payer: Commercial Managed Care - PPO | Admitting: Pulmonary Disease

## 2019-10-12 DIAGNOSIS — R0602 Shortness of breath: Secondary | ICD-10-CM

## 2019-10-12 LAB — PULMONARY FUNCTION TEST
DL/VA % pred: 87 %
DL/VA: 3.74 ml/min/mmHg/L
DLCO unc % pred: 96 %
DLCO unc: 21.34 ml/min/mmHg
FEF 25-75 Post: 2.12 L/sec
FEF 25-75 Pre: 1.17 L/sec
FEF2575-%Change-Post: 81 %
FEF2575-%Pred-Post: 75 %
FEF2575-%Pred-Pre: 41 %
FEV1-%Change-Post: 19 %
FEV1-%Pred-Post: 87 %
FEV1-%Pred-Pre: 73 %
FEV1-Post: 2.56 L
FEV1-Pre: 2.14 L
FEV1FVC-%Change-Post: 6 %
FEV1FVC-%Pred-Pre: 79 %
FEV6-%Change-Post: 13 %
FEV6-%Pred-Post: 102 %
FEV6-%Pred-Pre: 91 %
FEV6-Post: 3.71 L
FEV6-Pre: 3.28 L
FEV6FVC-%Change-Post: 0 %
FEV6FVC-%Pred-Post: 101 %
FEV6FVC-%Pred-Pre: 101 %
FVC-%Change-Post: 12 %
FVC-%Pred-Post: 101 %
FVC-%Pred-Pre: 90 %
FVC-Post: 3.77 L
FVC-Pre: 3.35 L
Post FEV1/FVC ratio: 68 %
Post FEV6/FVC ratio: 99 %
Pre FEV1/FVC ratio: 64 %
Pre FEV6/FVC Ratio: 98 %
RV % pred: 160 %
RV: 3.01 L
TLC % pred: 125 %
TLC: 6.62 L

## 2019-10-12 NOTE — Progress Notes (Signed)
PFT completed today.  

## 2019-10-13 ENCOUNTER — Ambulatory Visit (INDEPENDENT_AMBULATORY_CARE_PROVIDER_SITE_OTHER): Payer: Commercial Managed Care - PPO | Admitting: Pulmonary Disease

## 2019-10-13 ENCOUNTER — Encounter: Payer: Self-pay | Admitting: Pulmonary Disease

## 2019-10-13 VITALS — BP 134/78 | HR 81 | Ht 65.5 in | Wt 163.4 lb

## 2019-10-13 DIAGNOSIS — Z8619 Personal history of other infectious and parasitic diseases: Secondary | ICD-10-CM

## 2019-10-13 DIAGNOSIS — R0602 Shortness of breath: Secondary | ICD-10-CM

## 2019-10-13 DIAGNOSIS — J449 Chronic obstructive pulmonary disease, unspecified: Secondary | ICD-10-CM

## 2019-10-13 DIAGNOSIS — F17200 Nicotine dependence, unspecified, uncomplicated: Secondary | ICD-10-CM

## 2019-10-13 DIAGNOSIS — Z8616 Personal history of COVID-19: Secondary | ICD-10-CM

## 2019-10-13 DIAGNOSIS — R0609 Other forms of dyspnea: Secondary | ICD-10-CM

## 2019-10-13 DIAGNOSIS — R0600 Dyspnea, unspecified: Secondary | ICD-10-CM

## 2019-10-13 MED ORDER — TRELEGY ELLIPTA 100-62.5-25 MCG/INH IN AEPB: 1.0000 | INHALATION_SPRAY | Freq: Every day | RESPIRATORY_TRACT | 5 refills | Status: AC

## 2019-10-13 MED ORDER — TRELEGY ELLIPTA 100-62.5-25 MCG/INH IN AEPB: 1.0000 | INHALATION_SPRAY | Freq: Every day | RESPIRATORY_TRACT | 0 refills | Status: AC

## 2019-10-13 MED ORDER — VARENICLINE TARTRATE 0.5 MG X 11 & 1 MG X 42 PO MISC: ORAL | 0 refills | Status: DC

## 2019-10-13 NOTE — Progress Notes (Signed)
Synopsis: Referred in August 2020 for COPD by Elby Beck, FNP  Subjective:   PATIENT ID: Sophia Vance GENDER: female DOB: [birth year], MRN: [id number]  Chief Complaint  Patient presents with  . Follow-up    PFT yesterday. 6MW today. She reports she is still wheezin and her chest is tight. She reports walking almost daily at the mall.     Diagnosed with COVID 19, fever, cough, sweating, loss of taste and smell, on July 11th. Current smoker, smokes 1 ppd today, started smoking at age 53 yo.  Overall she has tried to quit smoking the past and has been unsuccessful.  She was given a prescription of Chantix in the past but never took the medication.  She understands that she needs to quit smoking but has been very difficult for her.  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  She has been able to work from home due to the pandemic as well as her diagnosis of COVID.  Her sister works for the court house and there was positive cases there.  Unfortunately her sister was diagnosed with COVID and asymptomatic which ended up spreading it to her niece as well as her and her husband.  Fortunately none of the family was hospitalized due to symptomatology.  She has been seen in our office before.  Back in 2017 she was evaluated with pulmonary function test and diagnosed with mild COPD.  She had an FEV1 of 91% predicted with a ratio less than 70.  OV 10/13/2019: Here today for follow-up regarding COPD.  Approximately 6 weeks ago had virtual visit with [employer]  Inhaler regimen switched to Symbicort twice daily.  Here to follow-up regarding her symptoms.  She also had a 6-minute walk test completed today.  She did well with this walking 550 m.  She does state that she is trying to walk weekly at the local mall inside.  She does feel dyspneic on exertion.  She was recently switched to Symbicort.  She states that she feels a little bit better on this.  She had PFTs completed yesterday  which revealed a FEV1 FVC ratio of 68 and a FEV1 of 87% predicted at 2.56 L.  She does have a significant bronchodilator response at 19%.  Normal DLCO as well as significant air trapping and hyperinflation noted with an RV of 160%.  She has having significant trouble quitting smoking.  Today we talked about this in the office.  She is currently smoking at least 1 cigarette an hour.  Upwards of 15 a day.     Past Medical History:  Diagnosis Date  . Anxiety   . Chest pain   . COPD (chronic obstructive pulmonary disease) ([place])   . COVID-19 05/2019     Family History  Problem Relation Age of Onset  . Osteoarthritis Mother   . Heart failure Mother   . Hypertension Mother   . COPD Mother   . Breast cancer Mother   . Cancer Father        Throat, lung, brain  . Heart attack Maternal Grandfather   . Kidney failure Maternal Grandmother   . Breast cancer Paternal Grandmother   . Colon cancer Neg Hx   . Esophageal cancer Neg Hx   . Stomach cancer Neg Hx   . Rectal cancer Neg Hx      Past Surgical History:  Procedure Laterality Date  . APPENDECTOMY    . BREAST SURGERY  Biopsy neg.    Social History   Socioeconomic History  . Marital status: Married    Spouse name: Not on file  . Number of children: Not on file  . Years of education: Not on file  . Highest education level: Not on file  Occupational History  . Not on file  Social Needs  . Financial resource strain: Not on file  . Food insecurity    Worry: Not on file    Inability: Not on file  . Transportation needs    Medical: Not on file    Non-medical: Not on file  Tobacco Use  . Smoking status: Current Every Day Smoker    Packs/day: 0.25    Years: 35.00    Pack years: 8.75    Types: Cigarettes  . Smokeless tobacco: Never Used  Substance and Sexual Activity  . Alcohol use: Yes    Alcohol/week: 0.0 standard drinks    Comment: Rare  . Drug use: No  . Sexual activity: Not Currently    Birth control/protection:  None    Comment: 1st intercourse 53 yo-5 partners  Lifestyle  . Physical activity    Days per week: Not on file    Minutes per session: Not on file  . Stress: Not on file  Relationships  . Social Herbalist on phone: Not on file    Gets together: Not on file    Attends religious service: Not on file    Active member of club or organization: Not on file    Attends meetings of clubs or organizations: Not on file    Relationship status: Not on file  . Intimate partner violence    Fear of current or ex partner: Not on file    Emotionally abused: Not on file    Physically abused: Not on file    Forced sexual activity: Not on file  Other Topics Concern  . Not on file  Social History Narrative   Married   1 child - daughter   Lives with spouse   Customer Service     No Known Allergies   Outpatient Medications Prior to Visit  Medication Sig Dispense Refill  . albuterol (VENTOLIN HFA) 108 (90 Base) MCG/ACT inhaler INHALE 1 TO 2 PUFFS INTO THE LUNGS EVERY6 HOURS AS NEEDED FOR WHEEZING OR SHORTNESS OF BREATH 8.5 g 1  . aspirin EC 81 MG tablet Take 81 mg by mouth daily.    . budesonide-formoterol (SYMBICORT) 80-4.5 MCG/ACT inhaler Inhale 2 puffs into the lungs 2 (two) times daily. 1 Inhaler 0  . cholecalciferol (VITAMIN D3) 25 MCG (1000 UT) tablet Take 1,000 Units by mouth daily.    . diphenhydrAMINE (BENADRYL) 25 MG tablet Take 25 mg by mouth 2 (two) times daily as needed for allergies.    Marland Kitchen FLUoxetine (PROZAC) 20 MG capsule Take 1 capsule (20 mg total) by mouth daily. 30 capsule 10  . ibuprofen (ADVIL,MOTRIN) 200 MG tablet Take 800 mg by mouth every 6 (six) hours as needed for headache (pain).    . nicotine (NICODERM CQ - DOSED IN MG/24 HOURS) 14 mg/24hr patch Place 1 patch (14 mg total) onto the skin daily. 28 patch 0  . omeprazole (PRILOSEC) 40 MG capsule Take 1 capsule (40 mg total) by mouth daily before breakfast. 30 capsule 11  . Simethicone (GAS RELIEF 125 MAX ST PO)  Take 1 tablet by mouth daily.     No facility-administered medications prior to visit.  Review of Systems  Constitutional: Negative for chills, fever, malaise/fatigue and weight loss.  HENT: Negative for hearing loss, sore throat and tinnitus.   Eyes: Negative for blurred vision and double vision.  Respiratory: Positive for cough, sputum production and shortness of breath. Negative for hemoptysis, wheezing and stridor.   Cardiovascular: Negative for chest pain, palpitations, orthopnea, leg swelling and PND.  Gastrointestinal: Negative for abdominal pain, constipation, diarrhea, heartburn, nausea and vomiting.  Genitourinary: Negative for dysuria, hematuria and urgency.  Musculoskeletal: Negative for joint pain and myalgias.  Skin: Negative for itching and rash.  Neurological: Positive for dizziness. Negative for tingling, weakness and headaches.       Lightheadedness  Endo/Heme/Allergies: Negative for environmental allergies. Does not bruise/bleed easily.  Psychiatric/Behavioral: Negative for depression. The patient is not nervous/anxious and does not have insomnia.   All other systems reviewed and are negative.    Objective:  Physical Exam Vitals signs reviewed.  Constitutional:      General: She is not in acute distress.    Appearance: She is well-developed.  HENT:     Head: Normocephalic and atraumatic.  Eyes:     General: No scleral icterus.    Conjunctiva/sclera: Conjunctivae normal.     Pupils: Pupils are equal, round, and reactive to light.  Neck:     Musculoskeletal: Neck supple.     Vascular: No JVD.     Trachea: No tracheal deviation.  Cardiovascular:     Rate and Rhythm: Normal rate and regular rhythm.     Heart sounds: Normal heart sounds. No murmur.  Pulmonary:     Effort: Pulmonary effort is normal. No tachypnea, accessory muscle usage or respiratory distress.     Breath sounds: No stridor. No wheezing, rhonchi or rales.  Abdominal:     General: Bowel  sounds are normal. There is no distension.     Palpations: Abdomen is soft.     Tenderness: There is no abdominal tenderness.  Musculoskeletal:        General: No tenderness.  Lymphadenopathy:     Cervical: No cervical adenopathy.  Skin:    General: Skin is warm and dry.     Capillary Refill: Capillary refill takes less than 2 seconds.     Findings: No rash.  Neurological:     Mental Status: She is alert and oriented to person, place, and time.  Psychiatric:        Behavior: Behavior normal.      Vitals:   10/13/19 0932  BP: 134/78  Pulse: 81  SpO2: 100%  Weight: 163 lb 6.4 oz (74.1 kg)  Height: 5' 5.5" (1.664 m)   100% on RA BMI Readings from Last 3 Encounters:  10/13/19 26.78 kg/m  08/25/19 27.46 kg/m  08/19/19 27.46 kg/m   Wt Readings from Last 3 Encounters:  10/13/19 163 lb 6.4 oz (74.1 kg)  08/25/19 160 lb (72.6 kg)  08/19/19 160 lb (72.6 kg)     CBC    Component Value Date/Time   WBC 12.3 (H) 08/05/2019 1609   RBC 4.62 08/05/2019 1609   HGB 14.3 08/05/2019 1609   HCT 42.6 08/05/2019 1609   PLT 324 08/05/2019 1609   MCV 92.2 08/05/2019 1609   MCH 31.0 08/05/2019 1609   MCHC 33.6 08/05/2019 1609   RDW 12.6 08/05/2019 1609   LYMPHSABS 2.0 05/16/2019 0926   MONOABS 0.6 05/16/2019 0926   EOSABS 0.0 05/16/2019 0926   BASOSABS 0.0 05/16/2019 0926     Chest Imaging: Chest x-ray  May 16, 2019 Clear, no infiltrate The patient's images have been independently reviewed by me.    Pulmonary Functions Testing Results: PFT Results Latest Ref Rng & Units 10/12/2019 02/23/2016  FVC-Pre L 3.35 3.74  FVC-Predicted Pre % 90 99  FVC-Post L 3.77 3.99  FVC-Predicted Post % 101 106  Pre FEV1/FVC % % 64 67  Post FEV1/FCV % % 68 70  FEV1-Pre L 2.14 2.49  FEV1-Predicted Pre % 73 83  FEV1-Post L 2.56 2.78  DLCO UNC% % 96 78  DLCO COR %Predicted % 87 71  TLC L 6.62 6.12  TLC % Predicted % 125 115  RV % Predicted % 160 127    FeNO: None  Pathology: None   Echocardiogram: None  Heart Catheterization: None    Assessment & Plan:     ICD-10-CM   1. DOE (dyspnea on exertion)  R06.00 ECHOCARDIOGRAM COMPLETE  2. Stage 1 mild COPD by GOLD classification ([place])  J44.9   3. History of 2019 novel coronavirus disease (COVID-19)  Z86.19   4. SOB (shortness of breath)  R06.02   5. TOBACCO ABUSE  F17.200     Discussion: This is a 53 year old, current smoker, history of COVID-24 May 2019.  Continued symptoms of COPD and dyspnea on exertion.  Was on Spiriva Respimat, changed to Symbicort.  Still having ongoing shortness of breath.  And dyspnea on exertion.  She also complains of lightheadedness at times.  She did have a myocardial perfusion SPECT which was normal in May.  Plan: We will send her for evaluation dyspnea on exertion with echocardiogram. We will get samples today of Trelegy New prescription today for Trelegy. Chantix starter pack. Patient was counseled again today on smoking cessation. We discussed various methods including tapering.  Greater than 50% of this patient's 25-minute office visit was in face-to-face discussing above recommendations and treatment plan.    Current Outpatient Medications:  .  albuterol (VENTOLIN HFA) 108 (90 Base) MCG/ACT inhaler, INHALE 1 TO 2 PUFFS INTO THE LUNGS EVERY6 HOURS AS NEEDED FOR WHEEZING OR SHORTNESS OF BREATH, Disp: 8.5 g, Rfl: 1 .  aspirin EC 81 MG tablet, Take 81 mg by mouth daily., Disp: , Rfl:  .  budesonide-formoterol (SYMBICORT) 80-4.5 MCG/ACT inhaler, Inhale 2 puffs into the lungs 2 (two) times daily., Disp: 1 Inhaler, Rfl: 0 .  cholecalciferol (VITAMIN D3) 25 MCG (1000 UT) tablet, Take 1,000 Units by mouth daily., Disp: , Rfl:  .  diphenhydrAMINE (BENADRYL) 25 MG tablet, Take 25 mg by mouth 2 (two) times daily as needed for allergies., Disp: , Rfl:  .  FLUoxetine (PROZAC) 20 MG capsule, Take 1 capsule (20 mg total) by mouth daily., Disp: 30 capsule, Rfl: 10 .  ibuprofen (ADVIL,MOTRIN) 200  MG tablet, Take 800 mg by mouth every 6 (six) hours as needed for headache (pain)., Disp: , Rfl:  .  nicotine (NICODERM CQ - DOSED IN MG/24 HOURS) 14 mg/24hr patch, Place 1 patch (14 mg total) onto the skin daily., Disp: 28 patch, Rfl: 0 .  omeprazole (PRILOSEC) 40 MG capsule, Take 1 capsule (40 mg total) by mouth daily before breakfast., Disp: 30 capsule, Rfl: 11 .  Simethicone (GAS RELIEF 125 MAX ST PO), Take 1 tablet by mouth daily., Disp: , Rfl:    Garner Nash, DO [facility] 10/13/2019 9:36 AM

## 2019-10-13 NOTE — Progress Notes (Signed)
SIX MIN WALK 10/13/2019  Medications Symbicort 80 (2 puffs), Vitamin D3 25mg , Benadryl 25mg , Prozac 20mg - all taken approx 0800  Supplimental Oxygen during Test? (L/min) No  Laps 16  Partial Lap (in Meters) 8  Baseline BP (sitting) 118/66  Baseline Heartrate 83  Baseline Dyspnea (Borg Scale) 1  Baseline Fatigue (Borg Scale) 0  Baseline SPO2 99  BP (sitting) 144/76  Heartrate 94  Dyspnea (Borg Scale) 4  Fatigue (Borg Scale) 7  SPO2 100  BP (sitting) 134/78  Heartrate 82  SPO2 100  Stopped or Paused before Six Minutes No  Interpretation Angina;Dizziness;Hip pain  Distance Completed 552  Tech Comments: Pt noted Left sided chest tightness at end of walk, explained as a "twisting feeling", dizziness, and b/l hip discomfort during walk.  Pt walked a brisk pace throughout walk, dyspnea noted upon end of exam.  Pt did not stop during test for any reason.

## 2019-10-13 NOTE — Patient Instructions (Addendum)
Thank you for visiting Dr. Valeta Harms at [facility]. Today we recommend the following:  Orders Placed This Encounter  Procedures  . ECHOCARDIOGRAM COMPLETE   Trelegy Samples today plus prescription sent to pharmacy  Start chantix starter pack Start cigarette tapering method (Sunday - Saturday weekly allotment)   Return in about 8 weeks (around 12/08/2019) for with APP or Dr. Valeta Harms.    You must quit smoking. This is the single most important thing that you can do to improve your lung health.   S = Set a quit date. T = Tell family, friends, and the people around you that you plan to quit. A = Anticipate or plan ahead for the tough times you'll face while quitting. R = Remove cigarettes and other tobacco products from your home, car, and work T = Talk to [place] about getting help to quit  If you need help feel free to reach out to our office, [place] Smoking Cessation Class: 6402943245, call 1-800-QUIT-NOW, or visit [email or web address]  Please do your part to reduce the spread of COVID-19.

## 2019-10-15 ENCOUNTER — Other Ambulatory Visit (HOSPITAL_COMMUNITY): Payer: Commercial Managed Care - PPO

## 2019-10-16 ENCOUNTER — Telehealth: Payer: Self-pay | Admitting: Pulmonary Disease

## 2019-10-16 MED ORDER — NICOTINE 21 MG/24HR TD PT24: 21.0000 mg | MEDICATED_PATCH | Freq: Every day | TRANSDERMAL | 0 refills | Status: DC

## 2019-10-16 NOTE — Telephone Encounter (Signed)
Called and spoke with pt. Pt is wanting to know if an Rx for nicotine patches 21mg  could be called in to pharmacy in place of Chantix. Pt is nervous about taking Chantix.  Dr. Valeta Harms please advise on this. Thanks!

## 2019-10-16 NOTE — Telephone Encounter (Signed)
Yes ok to send prescription  BLI

## 2019-10-16 NOTE — Telephone Encounter (Signed)
Rx sent to pt's preferred pharmacy. Called and spoke with pt letting her know this had been done and she verbalized understanding. Nothing further needed.

## 2019-10-22 ENCOUNTER — Other Ambulatory Visit: Payer: Self-pay

## 2019-10-22 ENCOUNTER — Ambulatory Visit (HOSPITAL_COMMUNITY): Payer: Commercial Managed Care - PPO | Attending: Cardiology

## 2019-10-22 DIAGNOSIS — R0600 Dyspnea, unspecified: Secondary | ICD-10-CM | POA: Insufficient documentation

## 2019-10-22 DIAGNOSIS — R0609 Other forms of dyspnea: Secondary | ICD-10-CM

## 2019-10-23 ENCOUNTER — Other Ambulatory Visit: Payer: Self-pay | Admitting: Family Medicine

## 2019-10-23 DIAGNOSIS — N6320 Unspecified lump in the left breast, unspecified quadrant: Secondary | ICD-10-CM

## 2019-10-28 NOTE — Telephone Encounter (Signed)
Completed.

## 2019-11-09 ENCOUNTER — Other Ambulatory Visit: Payer: Commercial Managed Care - PPO

## 2019-11-18 ENCOUNTER — Other Ambulatory Visit: Payer: Self-pay

## 2019-11-18 ENCOUNTER — Ambulatory Visit
Admission: RE | Admit: 2019-11-18 | Discharge: 2019-11-18 | Disposition: A | Discharge status: Home / Self Care | Payer: Commercial Managed Care - PPO | Source: Ambulatory Visit | Attending: Family Medicine | Admitting: Family Medicine

## 2019-11-18 DIAGNOSIS — N6320 Unspecified lump in the left breast, unspecified quadrant: Secondary | ICD-10-CM

## 2019-12-09 ENCOUNTER — Ambulatory Visit: Payer: Commercial Managed Care - PPO | Admitting: Pulmonary Disease

## 2019-12-30 ENCOUNTER — Ambulatory Visit: Payer: Commercial Managed Care - PPO | Admitting: Pulmonary Disease

## 2020-01-27 ENCOUNTER — Ambulatory Visit: Payer: Commercial Managed Care - PPO | Admitting: Pulmonary Disease

## 2020-06-15 ENCOUNTER — Telehealth: Payer: Self-pay | Admitting: Pulmonary Disease

## 2020-06-15 ENCOUNTER — Ambulatory Visit (INDEPENDENT_AMBULATORY_CARE_PROVIDER_SITE_OTHER): Payer: Commercial Managed Care - PPO | Admitting: Acute Care

## 2020-06-15 ENCOUNTER — Encounter: Payer: Self-pay | Admitting: Acute Care

## 2020-06-15 DIAGNOSIS — F17200 Nicotine dependence, unspecified, uncomplicated: Secondary | ICD-10-CM

## 2020-06-15 DIAGNOSIS — J449 Chronic obstructive pulmonary disease, unspecified: Secondary | ICD-10-CM | POA: Diagnosis not present

## 2020-06-15 DIAGNOSIS — F17210 Nicotine dependence, cigarettes, uncomplicated: Secondary | ICD-10-CM | POA: Diagnosis not present

## 2020-06-15 MED ORDER — DOXYCYCLINE HYCLATE 100 MG PO TABS: 100.0000 mg | ORAL_TABLET | Freq: Two times a day (BID) | ORAL | 0 refills | Status: DC

## 2020-06-15 MED ORDER — ALBUTEROL SULFATE HFA 108 (90 BASE) MCG/ACT IN AERS: INHALATION_SPRAY | RESPIRATORY_TRACT | 1 refills | Status: DC

## 2020-06-15 NOTE — Telephone Encounter (Signed)
Please schedule for a tele visit. She may need a CXr, but may need Covid testing first. Thanks

## 2020-06-15 NOTE — Telephone Encounter (Signed)
Called and spoke with patient she states that she has not been feeling good overall since last time she was seen in office 10/13/19. She was diagnosed with Covid in July 2020. Over the last couple days she is more tired, more weak and today coughed up some dark sputum. She states she feels like something is stuck in chest and can't get it up and when she does finally get it up its dark color. She was taking Mucinex daily and nothing was coming up. She has tried steam showers as well as humidifier. She does not know if she needs to be seen, maybe have a chest x-ray or steroid. LOV 10/13/2019  Sarah please advise

## 2020-06-15 NOTE — Patient Instructions (Signed)
It was good talking with you today. Take Doxycycline 100 mg ( one tablet)  twice daily x 7 days Take Mucinex 1200 mg daily with a full glass of water Drink plenty of water, and stay hydrated. Use flutter valve Stop smoking while you are so sick. Continue Trelegy one puff once daily Rinse mouth after use We will call in a rescue inhaler ( Ventolin) Use 1-2 puffs as needed for breakthrough shortness of breath or wheezing Do not use more than 3 times daily Call if you are worse not better, or seek emergency care for a CXR We recommend getting the Covid Vaccine   Follow up tele visit in 1 week with whomever has available appointment.   Follow Up Instructions: Follow up tele visit in 1 week with whomever has available appointment.

## 2020-06-15 NOTE — Telephone Encounter (Signed)
Called and spoke with pt letting her know that SG stated to schedule a televisit. Pt verbalized understanding. Pt has been scheduled for a televisit today at 4pm with Judson Roch. Nothing further needed.

## 2020-06-15 NOTE — Progress Notes (Signed)
Virtual Visit via Telephone Note  I connected with Sophia Vance on 06/15/20 at  4:00 PM EDT by telephone and verified that I am speaking with the correct person using two identifiers.  Location: Patient: At home Provider: [facility], [place], [place], [address]   I discussed the limitations, risks, security and privacy concerns of performing an evaluation and management service by telephone and the availability of in person appointments. I also discussed with the patient that there may be a patient responsible charge related to this service. The patient expressed understanding and agreed to proceed.   History of Present Illness: Pt. Presents for an acute tele visit for weakness and chest congestion that she is unable to cough up.This has been going on for about 1 week. She has started feeling much worse the last few days.  She was diagnosed with Covid 24 May 2019. She continues to smoke 5 cigarettes daily, and she has not been vaccinated for Covid. She denies any known patient sick contacts. She denies any fever, no chills. She did try using Mucinex, but nothing was coming up. She is compliant with her Trelegy inhaler, but she had run out of her rescue inhaler, and she is using her daughters rescue inhaler about twice a day every day. She has not been Covid vaccinated. She states she has had no change in her sense of taste or smell. She states she does have some shortness of breath. She is still smoking.She states she is not wheezing. Discolored secretions, that she describes as dark. She has a sensation of being unable to clear her secretions. She denies any chest pain .  Will need a CXR if she gets worse not better, but will need a Covid test first as she has not been vaccinated, or will need to be last patient of the day with staff in full PPE. Marland Kitchen    Observations/Objective: DATA: PFTs 09/19/10 FVC 4.28 [122%) FEV1 2.68 [99%) F/F 63 TLC 120 DLCO 96% Mild instruction, no  bronchodilator response.  Echocardiogram 09/01/15 LV cavity is normal with normal global motion. EF 69% Mild MR, mild TR no evidence of pulmonary hypertension.  Assessment and Plan: Suspect COPD exacerbation Plan Doxycycline 100 mg twice daily x 7 days Mucinex 1200 mg daily with a full glass of water Drink plenty of water, and stay hydrated. Stop smoking while you are so sick. Continue Trelegy one puff once daily Rinse mouth after use We will call in a rescue inhaler ( Ventolin) Use 1-2 puffs as needed for breakthrough shortness of breath or wheezing Do not use more than 3 times daily Call if you are worse not better, or seek emergency care for a CXR We recommend getting the Covid Vaccine   Follow up tele visit in 1 week with whomever has available appointment.   Follow Up Instructions: Follow up tele visit in 1 week with whomever has available appointment.    I discussed the assessment and treatment plan with the patient. The patient was provided an opportunity to ask questions and all were answered. The patient agreed with the plan and demonstrated an understanding of the instructions.   The patient was advised to call back or seek an in-person evaluation if the symptoms worsen or if the condition fails to improve as anticipated.  I provided 30 minutes of non-face-to-face time during this encounter.  Magdalen Spatz, MSN, AGACNP-BC [facility] 06/15/2020   Magdalen Spatz, NP

## 2020-06-27 ENCOUNTER — Ambulatory Visit (INDEPENDENT_AMBULATORY_CARE_PROVIDER_SITE_OTHER): Payer: Commercial Managed Care - PPO | Admitting: Pulmonary Disease

## 2020-06-27 ENCOUNTER — Other Ambulatory Visit: Payer: Self-pay

## 2020-06-27 ENCOUNTER — Encounter: Payer: Self-pay | Admitting: Pulmonary Disease

## 2020-06-27 DIAGNOSIS — F17200 Nicotine dependence, unspecified, uncomplicated: Secondary | ICD-10-CM

## 2020-06-27 NOTE — Patient Instructions (Signed)
3 attempts were made to reach you to complete a telephone visit that you are scheduled at 11 AM with our office.  Please contact her office back to reschedule this appointment as we are unsuccessful in reaching you.  Wyn Quaker, FNP

## 2020-06-27 NOTE — Progress Notes (Signed)
Virtual Visit via Telephone Note  3 attempts were made to reach the patient.  2 voicemails were left.  Unsuccessful  Location: Patient: Home Provider: [facility] - [address], [place], [place], [place] [id number]   I discussed the limitations, risks, security and privacy concerns of performing an evaluation and management service by telephone and the availability of in person appointments. I also discussed with the patient that there may be a patient responsible charge related to this service. The patient expressed understanding and agreed to proceed.  Patient consented to consult via telephone: Yes People present and their role in pt care: Pt   History of Present Illness:  54 year old female current everyday smoker followed in our office for COPD  Past medical history: Depression, GERD Smoking history: Current smoker Maintenance: Trelegy Ellipta Patient of Dr. Valeta Harms  Chief complaint: 1 week follow up    54 year old female current everyday smoker completing a 1 week follow-up with our office today.  Patient was last treated telephonically on 06/15/2020 for a suspected COPD exacerbation.  It was recommended that time that she take doxycycline as well as stop smoking and continue Trelegy Ellipta.  Was also recommended that she seek emergency care if not feeling better and obtain a chest x-ray.  It was recommended that she obtain her Covid vaccine.  06/27/20 - 0830 - lvm  06/27/20 - 1110 -  attempt 06/27/20 - 1133 - lvm   Observations/Objective:  05/16/2019-CBC with differential-eosinophils relative 0, eosinophils absolute 0  08/05/2019-chest x-ray-no active cardiopulmonary disease  10/12/2019-pulmonary function test-FVC 3.35 (90% predicted), postbronchodilator ratio of 68, postbronchodilator FEV1 2.56 (87% predicted), positive bronchodilator response, mid flow reversibility, TLC 6.62 (125% predicted), DLCO 21.34 (96% predicted)  Social History   Tobacco Use   Smoking Status Current Every Day Smoker  . Packs/day: 0.25  . Years: 35.00  . Pack years: 8.75  . Types: Cigarettes  Smokeless Tobacco Never Used  Tobacco Comment   5 ciggs a day   Immunization History  Administered Date(s) Administered  . Influenza Inj Mdck Quad With Preservative 08/17/2019  . Influenza Split 08/06/2015  . Td 09/07/2010      Assessment and Plan:  No problem-specific Assessment & Plan notes found for this encounter.   Follow Up Instructions:  No follow-ups on file.   I discussed the assessment and treatment plan with the patient. The patient was provided an opportunity to ask questions and all were answered. The patient agreed with the plan and demonstrated an understanding of the instructions.   The patient was advised to call back or seek an in-person evaluation if the symptoms worsen or if the condition fails to improve as anticipated.  I provided 0 minutes of non-face-to-face time during this encounter.   Lauraine Rinne, NP

## 2020-07-12 ENCOUNTER — Other Ambulatory Visit: Payer: Self-pay | Admitting: *Deleted

## 2020-07-12 DIAGNOSIS — J449 Chronic obstructive pulmonary disease, unspecified: Secondary | ICD-10-CM

## 2020-07-12 MED ORDER — TRELEGY ELLIPTA 100-62.5-25 MCG/INH IN AEPB: 1.0000 | INHALATION_SPRAY | Freq: Every day | RESPIRATORY_TRACT | 1 refills | Status: DC

## 2020-09-01 ENCOUNTER — Other Ambulatory Visit: Payer: Self-pay

## 2020-09-01 MED ORDER — FLUOXETINE HCL 20 MG PO CAPS: 20.0000 mg | ORAL_CAPSULE | Freq: Every day | ORAL | 0 refills | Status: DC

## 2020-09-01 NOTE — Telephone Encounter (Signed)
CE scheduled for 09/24/20 w TW.

## 2020-09-01 NOTE — Telephone Encounter (Signed)
I asked CLaudia to call her and schedule CE as she is overdue since July and does not have appt scheduled. Per Dr. Loetta Rough 07/15/19 note "Okay for Prozac 20 mg daily with refill through 05/24/2020."

## 2020-09-06 ENCOUNTER — Telehealth: Payer: Self-pay | Admitting: Pulmonary Disease

## 2020-09-06 MED ORDER — PREDNISONE 10 MG PO TABS: ORAL_TABLET | ORAL | 0 refills | Status: DC

## 2020-09-06 MED ORDER — AZITHROMYCIN 250 MG PO TABS: ORAL_TABLET | ORAL | 0 refills | Status: AC

## 2020-09-06 NOTE — Telephone Encounter (Signed)
PCCM:  Start Steroid taper pack 40mg  X 4 days, taper down 10mg  daily every 4 days until stopped  Azithromycin Z-pack   Setup office visit with APP or me   Garner Nash, DO [facility] 09/06/2020 12:51 PM

## 2020-09-06 NOTE — Telephone Encounter (Signed)
Spoke with pt. She is aware of Dr. Juline Patch response. Rxs have been sent in. OV has been scheduled for 09/07/20 at 1630. Nothing further was needed.

## 2020-09-06 NOTE — Telephone Encounter (Signed)
Spoke with pt. States that she does not feel well. Reports increased coughing, chest congestion, shortness of breath, chest tightness and wheezing. Cough is producing thick mucus. Denies fever, chills, sweats, sick contacts. Symptoms started over 1 week ago. Pt has not tried any OTC medications. While on the phone with the pt, she is demanding to have a CT chest done. A friend of hers just did the LDCT and she is wanting to do that. If she doesn't qualify for this program, she just wants a CT chest.  Dr. Valeta Harms - please advise. Thanks.  *paged at [phone]

## 2020-09-07 ENCOUNTER — Ambulatory Visit (INDEPENDENT_AMBULATORY_CARE_PROVIDER_SITE_OTHER): Payer: Commercial Managed Care - PPO | Admitting: Pulmonary Disease

## 2020-09-07 ENCOUNTER — Encounter: Payer: Self-pay | Admitting: Pulmonary Disease

## 2020-09-07 ENCOUNTER — Other Ambulatory Visit: Payer: Self-pay

## 2020-09-07 VITALS — BP 122/76 | HR 91 | Temp 97.6°F | Ht 64.0 in | Wt 157.8 lb

## 2020-09-07 DIAGNOSIS — J849 Interstitial pulmonary disease, unspecified: Secondary | ICD-10-CM | POA: Diagnosis not present

## 2020-09-07 DIAGNOSIS — Z8616 Personal history of COVID-19: Secondary | ICD-10-CM | POA: Diagnosis not present

## 2020-09-07 DIAGNOSIS — J441 Chronic obstructive pulmonary disease with (acute) exacerbation: Secondary | ICD-10-CM

## 2020-09-07 DIAGNOSIS — F17210 Nicotine dependence, cigarettes, uncomplicated: Secondary | ICD-10-CM | POA: Diagnosis not present

## 2020-09-07 DIAGNOSIS — J449 Chronic obstructive pulmonary disease, unspecified: Secondary | ICD-10-CM

## 2020-09-07 MED ORDER — TRELEGY ELLIPTA 100-62.5-25 MCG/INH IN AEPB: 1.0000 | INHALATION_SPRAY | Freq: Every day | RESPIRATORY_TRACT | 6 refills | Status: DC

## 2020-09-07 MED ORDER — ALBUTEROL SULFATE HFA 108 (90 BASE) MCG/ACT IN AERS: INHALATION_SPRAY | RESPIRATORY_TRACT | 11 refills | Status: DC

## 2020-09-07 NOTE — Progress Notes (Signed)
Synopsis: Referred in August 2020 for COPD by Elby Beck, FNP  Subjective:   PATIENT ID: Sophia Vance GENDER: female DOB: 15-Nov-1965, MRN: [id number]  Chief Complaint  Patient presents with  . Follow-up    pt wants to talk about ct scan and meds    Diagnosed with COVID 19, fever, cough, sweating, loss of taste and smell, on July 11th. Current smoker, smokes 1 ppd today, started smoking at age 54 yo.  Overall Sophia Vance has tried to quit smoking the past and has been unsuccessful.  Sophia Vance was given a prescription of Chantix in the past but never took the medication.  Sophia Vance understands that Sophia Vance needs to quit smoking but has been very difficult for her.  Sophia Vance works for Therapist, art for a Veterinary surgeon.  Sophia Vance has been able to work from home due to the pandemic as well as her diagnosis of COVID.  Her sister works for the court house and there was positive cases there.  Unfortunately her sister was diagnosed with COVID and asymptomatic which ended up spreading it to her niece as well as her and her husband.  Fortunately none of the family was hospitalized due to symptomatology.  Sophia Vance has been seen in our office before.  Back in 2017 Sophia Vance was evaluated with pulmonary function test and diagnosed with mild COPD.  Sophia Vance had an FEV1 of 91% predicted with a ratio less than 70.  OV 10/13/2019: Here today for follow-up regarding COPD.  Approximately 6 weeks ago had virtual visit with [employer]  Inhaler regimen switched to Symbicort twice daily.  Here to follow-up regarding her symptoms.  Sophia Vance also had a 6-minute walk test completed today.  Sophia Vance did well with this walking 550 m.  Sophia Vance does state that Sophia Vance is trying to walk weekly at the local mall inside.  Sophia Vance does feel dyspneic on exertion.  Sophia Vance was recently switched to Symbicort.  Sophia Vance states that Sophia Vance feels a little bit better on this.  Sophia Vance had PFTs completed yesterday which revealed a FEV1 FVC ratio of 68 and a FEV1 of 87% predicted at 2.56 L.  Sophia Vance does  have a significant bronchodilator response at 19%.  Normal DLCO as well as significant air trapping and hyperinflation noted with an RV of 160%.  Sophia Vance has having significant trouble quitting smoking.  Today we talked about this in the office.  Sophia Vance is currently smoking at least 1 cigarette an hour.  Upwards of 15 a day.  OV 09/07/2020: Sophia Vance is here today for an acute visit. Sophia Vance called yesterday for increasing sob, cough and sputum production. Sophia Vance was started on azithro and prednisone.  Patient has used her inhaler couple of times over the past few days.  Sophia Vance states that Sophia Vance has been compliant with Trelegy.  Sophia Vance is worried that there is just something going on with her lungs.  Sophia Vance is not been feeling any better.  Sophia Vance also has ongoing shortness of breath and dyspnea.  Sophia Vance is concerned that some of her symptoms may be lingering from her prior Covid diagnosis.  Sophia Vance had pulmonary function test completed in the past which showed reduced ratio and a mildly reduced FEV1 consistent with stage I COPD.  Of note Sophia Vance had a Covid test last week which was negative.     Past Medical History:  Diagnosis Date  . Anxiety   . Chest pain   . COPD (chronic obstructive pulmonary disease) ([place])   . COVID-19 05/2019  Family History  Problem Relation Age of Onset  . Osteoarthritis Mother   . Heart failure Mother   . Hypertension Mother   . COPD Mother   . Breast cancer Mother   . Cancer Father        Throat, lung, brain  . Heart attack Maternal Grandfather   . Kidney failure Maternal Grandmother   . Breast cancer Paternal Grandmother   . Colon cancer Neg Hx   . Esophageal cancer Neg Hx   . Stomach cancer Neg Hx   . Rectal cancer Neg Hx      Past Surgical History:  Procedure Laterality Date  . APPENDECTOMY    . BREAST SURGERY     Biopsy neg.    Social History   Socioeconomic History  . Marital status: Married    Spouse name: Not on file  . Number of children: Not on file  . Years of education: Not  on file  . Highest education level: Not on file  Occupational History  . Not on file  Tobacco Use  . Smoking status: Current Every Day Smoker    Packs/day: 0.25    Years: 35.00    Pack years: 8.75    Types: Cigarettes  . Smokeless tobacco: Never Used  . Tobacco comment: 5 ciggs a day  Vaping Use  . Vaping Use: Former  Substance and Sexual Activity  . Alcohol use: Yes    Alcohol/week: 0.0 standard drinks    Comment: Rare  . Drug use: No  . Sexual activity: Not Currently    Birth control/protection: None    Comment: 1st intercourse 77 yo-5 partners  Other Topics Concern  . Not on file  Social History Narrative   Married   1 child - daughter   Lives with spouse   Customer Service   Social Determinants of Health   Financial Resource Strain:   . Difficulty of Paying Living Expenses: Not on file  Food Insecurity:   . Worried About Charity fundraiser in the Last Year: Not on file  . Ran Out of Food in the Last Year: Not on file  Transportation Needs:   . Lack of Transportation (Medical): Not on file  . Lack of Transportation (Non-Medical): Not on file  Physical Activity:   . Days of Exercise per Week: Not on file  . Minutes of Exercise per Session: Not on file  Stress:   . Feeling of Stress : Not on file  Social Connections:   . Frequency of Communication with Friends and Family: Not on file  . Frequency of Social Gatherings with Friends and Family: Not on file  . Attends Religious Services: Not on file  . Active Member of Clubs or Organizations: Not on file  . Attends Archivist Meetings: Not on file  . Marital Status: Not on file  Intimate Partner Violence:   . Fear of Current or Ex-Partner: Not on file  . Emotionally Abused: Not on file  . Physically Abused: Not on file  . Sexually Abused: Not on file     No Known Allergies   Outpatient Medications Prior to Visit  Medication Sig Dispense Refill  . albuterol (VENTOLIN HFA) 108 (90 Base) MCG/ACT  inhaler INHALE 1 TO 2 PUFFS INTO THE LUNGS EVERY6 HOURS AS NEEDED FOR WHEEZING OR SHORTNESS OF BREATH 8.5 g 1  . aspirin EC 81 MG tablet Take 81 mg by mouth daily.    Marland Kitchen azithromycin (ZITHROMAX Z-PAK) 250 MG tablet Take  2 tablets (500 mg) on  Day 1,  followed by 1 tablet (250 mg) once daily on Days 2 through 5. 6 each 0  . cholecalciferol (VITAMIN D3) 25 MCG (1000 UT) tablet Take 1,000 Units by mouth daily.    . diphenhydrAMINE (BENADRYL) 25 MG tablet Take 25 mg by mouth 2 (two) times daily as needed for allergies.    Marland Kitchen FLUoxetine (PROZAC) 20 MG capsule Take 1 capsule (20 mg total) by mouth daily. 30 capsule 0  . Fluticasone-Umeclidin-Vilant (TRELEGY ELLIPTA) 100-62.5-25 MCG/INH AEPB Inhale 1 puff into the lungs daily. 28 each 1  . ibuprofen (ADVIL,MOTRIN) 200 MG tablet Take 800 mg by mouth every 6 (six) hours as needed for headache (pain).    . nicotine (NICODERM CQ - DOSED IN MG/24 HOURS) 21 mg/24hr patch Place 1 patch (21 mg total) onto the skin daily. 28 patch 0  . omeprazole (PRILOSEC) 40 MG capsule Take 1 capsule (40 mg total) by mouth daily before breakfast. 30 capsule 11  . predniSONE (DELTASONE) 10 MG tablet 40mg  X 4 days, 30mg  x4 days, 20mg  x4 days, 10mg  x4 days then stop 40 tablet 0  . doxycycline (VIBRA-TABS) 100 MG tablet Take 1 tablet (100 mg total) by mouth 2 (two) times daily. 14 tablet 0  . Simethicone (GAS RELIEF 125 MAX ST PO) Take 1 tablet by mouth daily.    . varenicline (CHANTIX PAK) 0.5 MG X 11 & 1 MG X 42 tablet Take one 0.5 mg tablet by mouth once daily for 3 days, then increase to one 0.5 mg tablet twice daily for 4 days, then increase to one 1 mg tablet twice daily. 53 tablet 0   No facility-administered medications prior to visit.    Review of Systems  Constitutional: Negative for chills, fever, malaise/fatigue and weight loss.  HENT: Negative for hearing loss, sore throat and tinnitus.   Eyes: Negative for blurred vision and double vision.  Respiratory: Positive for  cough, sputum production and shortness of breath. Negative for hemoptysis, wheezing and stridor.   Cardiovascular: Negative for chest pain, palpitations, orthopnea, leg swelling and PND.  Gastrointestinal: Negative for abdominal pain, constipation, diarrhea, heartburn, nausea and vomiting.  Genitourinary: Negative for dysuria, hematuria and urgency.  Musculoskeletal: Negative for joint pain and myalgias.  Skin: Negative for itching and rash.  Neurological: Negative for dizziness, tingling, weakness and headaches.  Endo/Heme/Allergies: Negative for environmental allergies. Does not bruise/bleed easily.  Psychiatric/Behavioral: Negative for depression. The patient is not nervous/anxious and does not have insomnia.   All other systems reviewed and are negative.    Objective:  Physical Exam Vitals reviewed.  Constitutional:      General: Sophia Vance is not in acute distress.    Appearance: Sophia Vance is well-developed.  HENT:     Head: Normocephalic and atraumatic.  Eyes:     General: No scleral icterus.    Conjunctiva/sclera: Conjunctivae normal.     Pupils: Pupils are equal, round, and reactive to light.  Neck:     Vascular: No JVD.     Trachea: No tracheal deviation.  Cardiovascular:     Rate and Rhythm: Normal rate and regular rhythm.     Heart sounds: Normal heart sounds. No murmur heard.   Pulmonary:     Effort: Pulmonary effort is normal. No tachypnea, accessory muscle usage or respiratory distress.     Breath sounds: Normal breath sounds. No stridor. No wheezing, rhonchi or rales.  Abdominal:     General: Bowel sounds are normal. There  is no distension.     Palpations: Abdomen is soft.     Tenderness: There is no abdominal tenderness.  Musculoskeletal:        General: No tenderness.     Cervical back: Neck supple.  Lymphadenopathy:     Cervical: No cervical adenopathy.  Skin:    General: Skin is warm and dry.     Capillary Refill: Capillary refill takes less than 2 seconds.      Findings: No rash.  Neurological:     Mental Status: Sophia Vance is alert and oriented to person, place, and time.  Psychiatric:        Behavior: Behavior normal.      Vitals:   09/07/20 1643  BP: 122/76  Pulse: 91  Temp: 97.6 F (36.4 C)  TempSrc: Oral  SpO2: 98%  Weight: 157 lb 12.8 oz (71.6 kg)  Height: 5\' 4"  (1.626 m)   98% on RA BMI Readings from Last 3 Encounters:  09/07/20 27.09 kg/m  10/13/19 26.78 kg/m  08/25/19 27.46 kg/m   Wt Readings from Last 3 Encounters:  09/07/20 157 lb 12.8 oz (71.6 kg)  10/13/19 163 lb 6.4 oz (74.1 kg)  08/25/19 160 lb (72.6 kg)     CBC    Component Value Date/Time   WBC 12.3 (H) 08/05/2019 1609   RBC 4.62 08/05/2019 1609   HGB 14.3 08/05/2019 1609   HCT 42.6 08/05/2019 1609   PLT 324 08/05/2019 1609   MCV 92.2 08/05/2019 1609   MCH 31.0 08/05/2019 1609   MCHC 33.6 08/05/2019 1609   RDW 12.6 08/05/2019 1609   LYMPHSABS 2.0 05/16/2019 0926   MONOABS 0.6 05/16/2019 0926   EOSABS 0.0 05/16/2019 0926   BASOSABS 0.0 05/16/2019 0926    Chest Imaging: Chest x-ray May 16, 2019 Clear, no infiltrate The patient's images have been independently reviewed by me.    Pulmonary Functions Testing Results: PFT Results Latest Ref Rng & Units 10/12/2019 02/23/2016  FVC-Pre L 3.35 3.74  FVC-Predicted Pre % 90 99  FVC-Post L 3.77 3.99  FVC-Predicted Post % 101 106  Pre FEV1/FVC % % 64 67  Post FEV1/FCV % % 68 70  FEV1-Pre L 2.14 2.49  FEV1-Predicted Pre % 73 83  FEV1-Post L 2.56 2.78  DLCO uncorrected ml/min/mmHg 21.34 20.53  DLCO UNC% % 96 78  DLCO corrected ml/min/mmHg - 20.17  DLCO COR %Predicted % - 76  DLVA Predicted % 87 71  TLC L 6.62 6.12  TLC % Predicted % 125 115  RV % Predicted % 160 127    FeNO: None  Pathology: None  Echocardiogram:   IMPRESSIONS  1. Left ventricular ejection fraction, by visual estimation, is 65 to  70%. The left ventricle has normal function. There is no left ventricular  hypertrophy.  2.  The left ventricle has no regional wall motion abnormalities.  3. Global right ventricle has normal systolic function.The right  ventricular size is normal. No increase in right ventricular wall  thickness.  4. Left atrial size was normal.  5. Right atrial size was normal.  6. The mitral valve is normal in structure. Trivial mitral valve  regurgitation. No evidence of mitral stenosis.  7. The tricuspid valve is normal in structure. Tricuspid valve  regurgitation is mild.  8. The aortic valve is normal in structure. Aortic valve regurgitation is  not visualized. No evidence of aortic valve sclerosis or stenosis.  9. The pulmonic valve was normal in structure. Pulmonic valve  regurgitation is not  visualized.  10. Normal pulmonary artery systolic pressure.  11. The inferior vena cava is normal in size with greater than 50%  respiratory variability, suggesting right atrial pressure of 3 mmHg.   Heart Catheterization: None    Assessment & Plan:     ICD-10-CM   1. Acute exacerbation of chronic obstructive pulmonary disease (COPD) ([place])  J44.1   2. Interstitial pulmonary disease ([facility])  J84.9 CT CHEST HIGH RESOLUTION  3. Chronic obstructive pulmonary disease, unspecified COPD type ([facility])  J44.9 Fluticasone-Umeclidin-Vilant (TRELEGY ELLIPTA) 100-62.5-25 MCG/INH AEPB    albuterol (VENTOLIN HFA) 108 (90 Base) MCG/ACT inhaler  4. Stage 1 mild COPD by GOLD classification ([place])  J44.9   5. History of COVID-19  Z86.16     Discussion:  This is a 54 year old, current smoker, history of COVID-24 May 2019, mild stage I COPD.  Having increased shortness of breath cough sputum production treated for acute exacerbation of COPD started on azithromycin and prednisone yesterday via phone.  Here today for evaluation and follow-up.  Sophia Vance is also concerned that these symptoms really have been progressive over the past couple of months and that there may be some lingering symptoms related to her prior  COVID-19 diagnosis.  Plan: We will have a high-resolution CT scan of the chest completed for further evaluation of the lung parenchyma. Continue prescription of Trelegy Patient was counseled on smoking cessation as documented below. Complete course of Z-Pak and prednisone taper.   Smoking Cessation Counseling:   The patient's current tobacco use: 35+ years, 1ppd at max, started at age 71 yo, down to 1 per day  The patient was advised to quit and impact of smoking on their health.  I assessed the patient's willingness to attempt to quit. I provided methods and skills for cessation. We reviewed medication management of smoking session drugs if appropriate. Sophia Vance tried chantix and had too many side effects (weird dreams)  Resources to help quit smoking were provided. A smoking cessation quit date was set: Nov 05, 2020 Follow-up was arranged in our clinic.  The amount of time spent counseling patient was 5 mins, separate from office visit time.      Current Outpatient Medications:  .  albuterol (VENTOLIN HFA) 108 (90 Base) MCG/ACT inhaler, INHALE 1 TO 2 PUFFS INTO THE LUNGS EVERY6 HOURS AS NEEDED FOR WHEEZING OR SHORTNESS OF BREATH, Disp: 8.5 g, Rfl: 1 .  aspirin EC 81 MG tablet, Take 81 mg by mouth daily., Disp: , Rfl:  .  azithromycin (ZITHROMAX Z-PAK) 250 MG tablet, Take 2 tablets (500 mg) on  Day 1,  followed by 1 tablet (250 mg) once daily on Days 2 through 5., Disp: 6 each, Rfl: 0 .  cholecalciferol (VITAMIN D3) 25 MCG (1000 UT) tablet, Take 1,000 Units by mouth daily., Disp: , Rfl:  .  diphenhydrAMINE (BENADRYL) 25 MG tablet, Take 25 mg by mouth 2 (two) times daily as needed for allergies., Disp: , Rfl:  .  FLUoxetine (PROZAC) 20 MG capsule, Take 1 capsule (20 mg total) by mouth daily., Disp: 30 capsule, Rfl: 0 .  Fluticasone-Umeclidin-Vilant (TRELEGY ELLIPTA) 100-62.5-25 MCG/INH AEPB, Inhale 1 puff into the lungs daily., Disp: 28 each, Rfl: 1 .  ibuprofen (ADVIL,MOTRIN) 200 MG tablet,  Take 800 mg by mouth every 6 (six) hours as needed for headache (pain)., Disp: , Rfl:  .  nicotine (NICODERM CQ - DOSED IN MG/24 HOURS) 21 mg/24hr patch, Place 1 patch (21 mg total) onto the skin daily., Disp: 28 patch, Rfl:  0 .  omeprazole (PRILOSEC) 40 MG capsule, Take 1 capsule (40 mg total) by mouth daily before breakfast., Disp: 30 capsule, Rfl: 11 .  predniSONE (DELTASONE) 10 MG tablet, 40mg  X 4 days, 30mg  x4 days, 20mg  x4 days, 10mg  x4 days then stop, Disp: 40 tablet, Rfl: 0 .  doxycycline (VIBRA-TABS) 100 MG tablet, Take 1 tablet (100 mg total) by mouth 2 (two) times daily., Disp: 14 tablet, Rfl: 0 .  Simethicone (GAS RELIEF 125 MAX ST PO), Take 1 tablet by mouth daily., Disp: , Rfl:  .  varenicline (CHANTIX PAK) 0.5 MG X 11 & 1 MG X 42 tablet, Take one 0.5 mg tablet by mouth once daily for 3 days, then increase to one 0.5 mg tablet twice daily for 4 days, then increase to one 1 mg tablet twice daily., Disp: 53 tablet, Rfl: 0  I spent 30 minutes dedicated to the care of this patient on the date of this encounter to include pre-visit review of records, face-to-face time with the patient discussing conditions above, post visit ordering of testing, clinical documentation with the electronic health record, making appropriate referrals as documented, and communicating necessary findings to members of the patients care team.    Garner Nash, DO [facility] 09/07/2020 4:45 PM

## 2020-09-07 NOTE — Patient Instructions (Addendum)
Thank you for visiting Dr. Valeta Harms at [facility]. Today we recommend the following:  Orders Placed This Encounter  Procedures  . CT CHEST HIGH RESOLUTION   Meds ordered this encounter  Medications  . Fluticasone-Umeclidin-Vilant (TRELEGY ELLIPTA) 100-62.5-25 MCG/INH AEPB    Sig: Inhale 1 puff into the lungs daily.    Dispense:  28 each    Refill:  6  . albuterol (VENTOLIN HFA) 108 (90 Base) MCG/ACT inhaler    Sig: INHALE 1 TO 2 PUFFS INTO THE LUNGS EVERY6 HOURS AS NEEDED FOR WHEEZING OR SHORTNESS OF BREATH    Dispense:  8.5 g    Refill:  11   Return in about 6 weeks (around 10/19/2020) for with APP or Dr. Valeta Harms.    Please do your part to reduce the spread of COVID-19.

## 2020-09-23 ENCOUNTER — Telehealth: Payer: Self-pay | Admitting: Pulmonary Disease

## 2020-09-23 NOTE — Telephone Encounter (Signed)
Spoke with patient. She was made aware of Brian's recommendations. She will obtain the CT on 11/24. She is now using Pepcid instead of omeprazole.

## 2020-09-23 NOTE — Telephone Encounter (Signed)
09/23/2020  No new recommendations.  As the patient is concerned about her symptoms she needs to seek emergent evaluation at an urgent care or emergency room.  Recommend obtaining high-resolution CT chest as scheduled.  Please ensure the patient is still adherent to taking her Prilosec and Trelegy Ellipta.  If the patient does still continue to smoke she needs understand this will make it difficult for [place] to manage her pulmonary needs.  It would be highly recommended that she stop smoking given her current acute symptoms, known COPD as well as potential flares of acid reflux.  Is the patient having any breakthrough reflux symptoms?  Wyn Quaker, FNP

## 2020-09-23 NOTE — Telephone Encounter (Signed)
Primary Pulmonologist: BI Last office visit and with whom: 09/07/20 with BI What do we see them for (pulmonary problems): COPD Last OV assessment/plan: Plan: We will have a high-resolution CT scan of the chest completed for further evaluation of the lung parenchyma. Continue prescription of Trelegy Patient was counseled on smoking cessation as documented below. Complete course of Z-Pak and prednisone taper.  Was appointment offered to patient (explain)?  No, patient wanted recommendations.   Reason for call: Spoke with patient. She stated that she was recently seen by BI on 09/07/20 and was given a zpak and prednisone taper. She has finished both of these and still does not feel any better. She is still having the chest pressure. States this has been going on for at least a year. She is also noticing some upper back pain, described as a burning sensation. She is currently using a warm compress to help with the chest pressure. Denies any recent heavy lifting.   She was cleared by cardiology for the chest pressure.   She also has a non-productive cough. She is constantly clearing her throat. Denies any fevers or body aches.   She is scheduled for a High Res CT on 09/28/20  She wants to know if there is anything else she can do with the chest pressure.   Aaron Edelman, can you please advise since BI is not available today? Thanks!   No Known Allergies  Immunization History  Administered Date(s) Administered   Influenza Inj Mdck Quad With Preservative 08/17/2019   Influenza Split 08/06/2015   Td 09/07/2010

## 2020-09-28 ENCOUNTER — Ambulatory Visit (INDEPENDENT_AMBULATORY_CARE_PROVIDER_SITE_OTHER)
Admission: RE | Admit: 2020-09-28 | Discharge: 2020-09-28 | Disposition: A | Discharge status: Home / Self Care | Payer: Commercial Managed Care - PPO | Source: Ambulatory Visit | Attending: Pulmonary Disease | Admitting: Pulmonary Disease

## 2020-09-28 ENCOUNTER — Other Ambulatory Visit: Payer: Self-pay

## 2020-09-28 DIAGNOSIS — J849 Interstitial pulmonary disease, unspecified: Secondary | ICD-10-CM

## 2020-10-04 ENCOUNTER — Other Ambulatory Visit: Payer: Self-pay

## 2020-10-04 ENCOUNTER — Encounter: Payer: Self-pay | Admitting: Nurse Practitioner

## 2020-10-04 ENCOUNTER — Ambulatory Visit (INDEPENDENT_AMBULATORY_CARE_PROVIDER_SITE_OTHER): Payer: Commercial Managed Care - PPO | Admitting: Nurse Practitioner

## 2020-10-04 VITALS — BP 124/84 | Ht 64.75 in | Wt 157.6 lb

## 2020-10-04 DIAGNOSIS — B009 Herpesviral infection, unspecified: Secondary | ICD-10-CM | POA: Diagnosis not present

## 2020-10-04 DIAGNOSIS — Z13220 Encounter for screening for lipoid disorders: Secondary | ICD-10-CM

## 2020-10-04 DIAGNOSIS — Z01419 Encounter for gynecological examination (general) (routine) without abnormal findings: Secondary | ICD-10-CM

## 2020-10-04 DIAGNOSIS — K219 Gastro-esophageal reflux disease without esophagitis: Secondary | ICD-10-CM | POA: Diagnosis not present

## 2020-10-04 DIAGNOSIS — F419 Anxiety disorder, unspecified: Secondary | ICD-10-CM

## 2020-10-04 MED ORDER — FLUOXETINE HCL 20 MG PO CAPS: 20.0000 mg | ORAL_CAPSULE | Freq: Every day | ORAL | 0 refills | Status: DC

## 2020-10-04 MED ORDER — ALPRAZOLAM 0.25 MG PO TABS: 0.2500 mg | ORAL_TABLET | Freq: Every day | ORAL | 0 refills | Status: DC | PRN

## 2020-10-04 MED ORDER — VALACYCLOVIR HCL 1 G PO TABS: 1000.0000 mg | ORAL_TABLET | Freq: Two times a day (BID) | ORAL | 2 refills | Status: DC

## 2020-10-04 MED ORDER — OMEPRAZOLE 40 MG PO CPDR: 40.0000 mg | DELAYED_RELEASE_CAPSULE | Freq: Every day | ORAL | 3 refills | Status: DC

## 2020-10-04 MED ORDER — BUPROPION HCL ER (XL) 150 MG PO TB24: 150.0000 mg | ORAL_TABLET | Freq: Every day | ORAL | 2 refills | Status: DC

## 2020-10-04 NOTE — Progress Notes (Signed)
Sophia Vance Mar 31, 1966 [id number]   History:  54 y.o. G1P1 presents for annual exam. Postmenopausal - no HRT. Normal pap and mammogram history. Has been struggling with anxiety and depression. She cares for her husband who is beginning to have dementia, lost her job a year and a half ago but is now employed there again. She feels chest pressure within an hour of waking and develops acid reflux throughout the day even without eating. She had cardiac/GI workup for these symptoms last October with negative findings. She is tearful during visit today. She takes expired xanax occasionally as needed. She has been on Prozac since 2006 and does not feel it is as effective. She is a smoker. COPD managed by pulmonology.   Gynecologic History Patient's last menstrual period was 03/18/2019 (approximate).   Contraception: post menopausal status Last Pap: 05/2019. Results were: normal  Last mammogram: 11/2019. Results were: normal Last colonoscopy: 08/2019. Results were: polyp  Past medical history, past surgical history, family history and social history were all reviewed and documented in the EPIC chart.  ROS:  A ROS was performed and pertinent positives and negatives are included.  Exam:  Vitals:   10/04/20 1030  BP: 124/84  Weight: 157 lb 9.6 oz (71.5 kg)  Height: 5' 4.75" (1.645 m)   Body mass index is 26.43 kg/m.  General appearance:  Normal Thyroid:  Symmetrical, normal in size, without palpable masses or nodularity. Respiratory  Auscultation:  Clear without wheezing or rhonchi Cardiovascular  Auscultation:  Regular rate, without rubs, murmurs or gallops  Edema/varicosities:  Not grossly evident Abdominal  Soft,nontender, without masses, guarding or rebound.  Liver/spleen:  No organomegaly noted  Hernia:  None appreciated  Skin  Inspection:  Grossly normal   Breasts: Examined lying and sitting.   Right: Without masses, retractions, discharge or axillary  adenopathy.   Left: Without masses, retractions, discharge or axillary adenopathy. Gentitourinary   Inguinal/mons:  Normal without inguinal adenopathy  External genitalia:  Normal  BUS/Urethra/Skene's glands:  Normal  Vagina:  Normal  Cervix:  Normal  Uterus: Normal in size, shape and contour.  Midline and mobile  Adnexa/parametria:     Rt: Without masses or tenderness.   Lt: Without masses or tenderness.  Anus and perineum: Normal  Digital rectal exam: Normal sphincter tone without palpated masses or tenderness  Assessment/Plan:  54 y.o. G1P1 for annual exam.   Well female exam with routine gynecological exam - Plan: CBC with Differential/Platelet, Comprehensive metabolic panel. Education provided on SBEs, importance of preventative screenings, current guidelines, high calcium diet, regular exercise, and multivitamin daily.   Anxiety - Plan: FLUoxetine (PROZAC) 20 MG capsule, buPROPion (WELLBUTRIN XL) 150 MG 24 hr tablet, ALPRAZolam (XANAX) 0.25 MG tablet. We agreed to stop Prozac and begin Wellbutrin 150 mg daily.  Recommended continuing Prozac the first week starting Wellbutrin, then cutting back to every other day, and then stopping. Provided her with 1 month refill of Prozac to complete taper. Discussed benefits of Wellbutrin for management of anxiety, to help with smoking cessation, and possible weight loss.  Xanax 0.25 mg as needed and to not exceed more than 1 dose per day.  She is aware of its addictive properties and to use sparingly.  Recommended counseling and she will consider this.  She will reach out in 3 to 4 weeks to let me know how she is doing on current dose of Wellbutrin and we will increase as needed.  Lipid screening - Plan: Lipid panel  Gastroesophageal reflux  disease without esophagitis - Plan: omeprazole (PRILOSEC) 40 MG capsule daily.  Reports severe acid reflux that causes her to spit up daily.  It is likely that this is related to anxiety/stress.  Provided her  with refills for 4 months and recommend follow-up with GI if she requires more than this.  HSV-1 (herpes simplex virus 1) infection - Plan: valACYclovir (VALTREX) 1000 MG tablet twice a day for 3-5 days for outbreaks.  She currently has an outbreak.   Screening for cervical cancer -normal Pap history.  Will repeat at 5-year interval per guidelines.  Screening for breast cancer -normal mammogram history.  Continue annual screenings.  Normal breast exam today.  Screening for colon cancer -we will repeat screening colonoscopy at GI's recommended interval.  Will return for fasting labs. Follow up in 1 year for annual.    Tamela Gammon [facility], 10:40 AM 10/04/2020

## 2020-10-04 NOTE — Patient Instructions (Signed)
Health Maintenance, Female Adopting a healthy lifestyle and getting preventive care are important in promoting health and wellness. Ask your health care provider about:  The right schedule for you to have regular tests and exams.  Things you can do on your own to prevent diseases and keep yourself healthy. What should I know about diet, weight, and exercise? Eat a healthy diet   Eat a diet that includes plenty of vegetables, fruits, low-fat dairy products, and lean protein.  Do not eat a lot of foods that are high in solid fats, added sugars, or sodium. Maintain a healthy weight Body mass index (BMI) is used to identify weight problems. It estimates body fat based on height and weight. Your health care provider can help determine your BMI and help you achieve or maintain a healthy weight. Get regular exercise Get regular exercise. This is one of the most important things you can do for your health. Most adults should:  Exercise for at least 150 minutes each week. The exercise should increase your heart rate and make you sweat (moderate-intensity exercise).  Do strengthening exercises at least twice a week. This is in addition to the moderate-intensity exercise.  Spend less time sitting. Even light physical activity can be beneficial. Watch cholesterol and blood lipids Have your blood tested for lipids and cholesterol at 52 years of age, then have this test every 5 years. Have your cholesterol levels checked more often if:  Your lipid or cholesterol levels are high.  You are older than 52 years of age.  You are at high risk for heart disease. What should I know about cancer screening? Depending on your health history and family history, you may need to have cancer screening at various ages. This may include screening for:  Breast cancer.  Cervical cancer.  Colorectal cancer.  Skin cancer.  Lung cancer. What should I know about heart disease, diabetes, and high blood  pressure? Blood pressure and heart disease  High blood pressure causes heart disease and increases the risk of stroke. This is more likely to develop in people who have high blood pressure readings, are of African descent, or are overweight.  Have your blood pressure checked: ? Every 3-5 years if you are 18-39 years of age. ? Every year if you are 40 years old or older. Diabetes Have regular diabetes screenings. This checks your fasting blood sugar level. Have the screening done:  Once every three years after age 40 if you are at a normal weight and have a low risk for diabetes.  More often and at a younger age if you are overweight or have a high risk for diabetes. What should I know about preventing infection? Hepatitis B If you have a higher risk for hepatitis B, you should be screened for this virus. Talk with your health care provider to find out if you are at risk for hepatitis B infection. Hepatitis C Testing is recommended for:  Everyone born from 1945 through 1965.  Anyone with known risk factors for hepatitis C. Sexually transmitted infections (STIs)  Get screened for STIs, including gonorrhea and chlamydia, if: ? You are sexually active and are younger than 52 years of age. ? You are older than 52 years of age and your health care provider tells you that you are at risk for this type of infection. ? Your sexual activity has changed since you were last screened, and you are at increased risk for chlamydia or gonorrhea. Ask your health care provider if   you are at risk.  Ask your health care provider about whether you are at high risk for HIV. Your health care provider may recommend a prescription medicine to help prevent HIV infection. If you choose to take medicine to prevent HIV, you should first get tested for HIV. You should then be tested every 3 months for as long as you are taking the medicine. Pregnancy  If you are about to stop having your period (premenopausal) and  you may become pregnant, seek counseling before you get pregnant.  Take 400 to 800 micrograms (mcg) of folic acid every day if you become pregnant.  Ask for birth control (contraception) if you want to prevent pregnancy. Osteoporosis and menopause Osteoporosis is a disease in which the bones lose minerals and strength with aging. This can result in bone fractures. If you are 65 years old or older, or if you are at risk for osteoporosis and fractures, ask your health care provider if you should:  Be screened for bone loss.  Take a calcium or vitamin D supplement to lower your risk of fractures.  Be given hormone replacement therapy (HRT) to treat symptoms of menopause. Follow these instructions at home: Lifestyle  Do not use any products that contain nicotine or tobacco, such as cigarettes, e-cigarettes, and chewing tobacco. If you need help quitting, ask your health care provider.  Do not use street drugs.  Do not share needles.  Ask your health care provider for help if you need support or information about quitting drugs. Alcohol use  Do not drink alcohol if: ? Your health care provider tells you not to drink. ? You are pregnant, may be pregnant, or are planning to become pregnant.  If you drink alcohol: ? Limit how much you use to 0-1 drink a day. ? Limit intake if you are breastfeeding.  Be aware of how much alcohol is in your drink. In the [place] one drink equals one 12 oz bottle of beer (355 mL), one 5 oz glass of wine (148 mL), or one 1 oz glass of hard liquor (44 mL). General instructions  Schedule regular health, dental, and eye exams.  Stay current with your vaccines.  Tell your health care provider if: ? You often feel depressed. ? You have ever been abused or do not feel safe at home. Summary  Adopting a healthy lifestyle and getting preventive care are important in promoting health and wellness.  Follow your health care provider's instructions about healthy  diet, exercising, and getting tested or screened for diseases.  Follow your health care provider's instructions on monitoring your cholesterol and blood pressure. This information is not intended to replace advice given to you by your health care provider. Make sure you discuss any questions you have with your health care provider. Document Revised: 10/15/2018 Document Reviewed: 10/15/2018 Elsevier Patient Education  2020 [employer]  

## 2020-10-13 ENCOUNTER — Inpatient Hospital Stay: Admission: RE | Admit: 2020-10-13 | Payer: Commercial Managed Care - PPO | Source: Ambulatory Visit

## 2020-10-20 ENCOUNTER — Ambulatory Visit (INDEPENDENT_AMBULATORY_CARE_PROVIDER_SITE_OTHER): Payer: Commercial Managed Care - PPO | Admitting: Pulmonary Disease

## 2020-10-20 ENCOUNTER — Encounter: Payer: Self-pay | Admitting: Pulmonary Disease

## 2020-10-20 ENCOUNTER — Other Ambulatory Visit: Payer: Self-pay

## 2020-10-20 VITALS — BP 114/80 | HR 93 | Temp 98.0°F | Ht 64.0 in | Wt 157.2 lb

## 2020-10-20 DIAGNOSIS — Z716 Tobacco abuse counseling: Secondary | ICD-10-CM

## 2020-10-20 DIAGNOSIS — J449 Chronic obstructive pulmonary disease, unspecified: Secondary | ICD-10-CM | POA: Diagnosis not present

## 2020-10-20 DIAGNOSIS — Z8616 Personal history of COVID-19: Secondary | ICD-10-CM | POA: Diagnosis not present

## 2020-10-20 DIAGNOSIS — F17210 Nicotine dependence, cigarettes, uncomplicated: Secondary | ICD-10-CM

## 2020-10-20 DIAGNOSIS — R918 Other nonspecific abnormal finding of lung field: Secondary | ICD-10-CM

## 2020-10-20 DIAGNOSIS — F17200 Nicotine dependence, unspecified, uncomplicated: Secondary | ICD-10-CM

## 2020-10-20 NOTE — Progress Notes (Signed)
Smoking Cessation Counseling:   The patient's current tobacco use: down 2 cigarettes per day  The patient was advised to quit and impact of smoking on their health.  I assessed the patient's willingness to attempt to quit. I provided methods and skills for cessation. Now on welbutrin and doing well  We reviewed medication management of smoking session drugs if appropriate. Resources to help quit smoking were provided. A smoking cessation quit date was set: November 05, 2020  Follow-up was arranged in our clinic.  The amount of time spent counseling patient was 10 mins.   [facility] 10/20/2020 4:30 PM

## 2020-10-20 NOTE — Progress Notes (Signed)
Synopsis: Referred in August 2020 for COPD by Elby Beck, FNP  Subjective:   PATIENT ID: Sophia Vance Platten GENDER: female DOB: [birth year], MRN: [id number]  Chief Complaint  Patient presents with  . Follow-up    CT results    Diagnosed with COVID 19, fever, cough, sweating, loss of taste and smell, on July 11th. Current smoker, smokes 1 ppd today, started smoking at age 54 yo.  Overall she has tried to quit smoking the past and has been unsuccessful.  She was given a prescription of Chantix in the past but never took the medication.  She understands that she needs to quit smoking but has been very difficult for her.  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  She has been able to work from home due to the pandemic as well as her diagnosis of COVID.  Her sister works for the court house and there was positive cases there.  Unfortunately her sister was diagnosed with COVID and asymptomatic which ended up spreading it to her niece as well as her and her husband.  Fortunately none of the family was hospitalized due to symptomatology.  She has been seen in our office before.  Back in 2017 she was evaluated with pulmonary function test and diagnosed with mild COPD.  She had an FEV1 of 91% predicted with a ratio less than 70.  OV 10/13/2019: Here today for follow-up regarding COPD.  Approximately 6 weeks ago had virtual visit with [employer]  Inhaler regimen switched to Symbicort twice daily.  Here to follow-up regarding her symptoms.  She also had a 6-minute walk test completed today.  She did well with this walking 550 m.  She does state that she is trying to walk weekly at the local mall inside.  She does feel dyspneic on exertion.  She was recently switched to Symbicort.  She states that she feels a little bit better on this.  She had PFTs completed yesterday which revealed a FEV1 FVC ratio of 68 and a FEV1 of 87% predicted at 2.56 L.  She does have a significant  bronchodilator response at 19%.  Normal DLCO as well as significant air trapping and hyperinflation noted with an RV of 160%.  She has having significant trouble quitting smoking.  Today we talked about this in the office.  She is currently smoking at least 1 cigarette an hour.  Upwards of 15 a day.  OV 09/07/2020: She is here today for an acute visit. She called yesterday for increasing sob, cough and sputum production. She was started on azithro and prednisone.  Patient has used her inhaler couple of times over the past few days.  She states that she has been compliant with Trelegy.  She is worried that there is just something going on with her lungs.  She is not been feeling any better.  She also has ongoing shortness of breath and dyspnea.  She is concerned that some of her symptoms may be lingering from her prior Covid diagnosis.  She had pulmonary function test completed in the past which showed reduced ratio and a mildly reduced FEV1 consistent with stage I COPD.  Of note she had a Covid test last week which was negative.  OV 10/20/2020: Patient here today for follow-up after recent CT imaging.  Patient had CT scan of the chest after having history of COVID-19.  She does not have any evidence on her CT scan of interstitial lung disease or  fibrosis or scarring related to Covid.  She does have multiple small pulmonary nodules 4 mm or less in size considered low risk.  She is a longstanding smoker with evidence of paraseptal emphysema.  She does still complain of vague epigastric pains and chest pains.  A lot of this seems to be anxiety related when discussing with the patient.  She is concerned about her gallbladder.  Whether or not this could be the culprit.     Past Medical History:  Diagnosis Date  . Anxiety   . Chest pain   . COPD (chronic obstructive pulmonary disease) ([place])   . COVID-19 05/2019     Family History  Problem Relation Age of Onset  . Osteoarthritis Mother   . Heart failure  Mother   . Hypertension Mother   . COPD Mother   . Breast cancer Mother   . Cancer Father        Throat, lung, brain  . Heart attack Maternal Grandfather   . Kidney failure Maternal Grandmother   . Breast cancer Paternal Grandmother   . Colon cancer Neg Hx   . Esophageal cancer Neg Hx   . Stomach cancer Neg Hx   . Rectal cancer Neg Hx      Past Surgical History:  Procedure Laterality Date  . APPENDECTOMY    . BREAST SURGERY     Biopsy neg.    Social History   Socioeconomic History  . Marital status: Married    Spouse name: Not on file  . Number of children: Not on file  . Years of education: Not on file  . Highest education level: Not on file  Occupational History  . Not on file  Tobacco Use  . Smoking status: Current Every Day Smoker    Packs/day: 0.25    Years: 35.00    Pack years: 8.75    Types: Cigarettes  . Smokeless tobacco: Never Used  . Tobacco comment: 5 ciggs a day  Vaping Use  . Vaping Use: Former  Substance and Sexual Activity  . Alcohol use: Yes    Alcohol/week: 0.0 standard drinks    Comment: Rare  . Drug use: No  . Sexual activity: Not Currently    Birth control/protection: None    Comment: 1st intercourse 1st intercourse 54 yo-5 partners  Other Topics Concern  . Not on file  Social History Narrative   Married   1 child - daughter   Lives with spouse   Customer Service   Social Determinants of Health   Financial Resource Strain: Not on file  Food Insecurity: Not on file  Transportation Needs: Not on file  Physical Activity: Not on file  Stress: Not on file  Social Connections: Not on file  Intimate Partner Violence: Not on file     No Known Allergies   Outpatient Medications Prior to Visit  Medication Sig Dispense Refill  . albuterol (VENTOLIN HFA) 108 (90 Base) MCG/ACT inhaler INHALE 1 TO 2 PUFFS INTO THE LUNGS EVERY6 HOURS AS NEEDED FOR WHEEZING OR SHORTNESS OF BREATH 8.5 g 11  . ALPRAZolam (XANAX) 0.25 MG tablet Take 1 tablet (0.25 mg total)  by mouth daily as needed for anxiety. 30 tablet 0  . aspirin EC 81 MG tablet Take 81 mg by mouth daily.    Marland Kitchen buPROPion (WELLBUTRIN XL) 150 MG 24 hr tablet Take 1 tablet (150 mg total) by mouth daily. 30 tablet 2  . cholecalciferol (VITAMIN D3) 25 MCG (1000 UT) tablet Take 1,000 Units by  mouth daily.    . diphenhydrAMINE (BENADRYL) 25 MG tablet Take 25 mg by mouth 2 (two) times daily as needed for allergies.    Marland Kitchen FLUoxetine (PROZAC) 20 MG capsule Take 1 capsule (20 mg total) by mouth daily. 30 capsule 0  . Fluticasone-Umeclidin-Vilant (TRELEGY ELLIPTA) 100-62.5-25 MCG/INH AEPB Inhale 1 puff into the lungs daily. 28 each 6  . ibuprofen (ADVIL,MOTRIN) 200 MG tablet Take 800 mg by mouth every 6 (six) hours as needed for headache (pain).    Marland Kitchen omeprazole (PRILOSEC) 40 MG capsule Take 1 capsule (40 mg total) by mouth daily before breakfast. 30 capsule 3  . nicotine (NICODERM CQ - DOSED IN MG/24 HOURS) 21 mg/24hr patch Place 1 patch (21 mg total) onto the skin daily. (Patient not taking: Reported on 10/20/2020) 28 patch 0  . predniSONE (DELTASONE) 10 MG tablet 40mg  X 4 days, 30mg  x4 days, 20mg  x4 days, 10mg  x4 days then stop (Patient not taking: Reported on 10/20/2020) 40 tablet 0  . valACYclovir (VALTREX) 1000 MG tablet Take 1 tablet (1,000 mg total) by mouth 2 (two) times daily. (Patient not taking: Reported on 10/20/2020) 10 tablet 2   No facility-administered medications prior to visit.    Review of Systems  Constitutional: Negative for chills, fever, malaise/fatigue and weight loss.  HENT: Negative for hearing loss, sore throat and tinnitus.   Eyes: Negative for blurred vision and double vision.  Respiratory: Negative for cough, hemoptysis, sputum production, shortness of breath, wheezing and stridor.   Cardiovascular: Positive for chest pain. Negative for palpitations, orthopnea, leg swelling and PND.  Gastrointestinal: Positive for abdominal pain. Negative for constipation, diarrhea, heartburn,  nausea and vomiting.  Genitourinary: Negative for dysuria, hematuria and urgency.  Musculoskeletal: Negative for joint pain and myalgias.  Skin: Negative for itching and rash.  Neurological: Negative for dizziness, tingling, weakness and headaches.  Endo/Heme/Allergies: Negative for environmental allergies. Does not bruise/bleed easily.  Psychiatric/Behavioral: Negative for depression. The patient is nervous/anxious. The patient does not have insomnia.   All other systems reviewed and are negative.    Objective:  Physical Exam Vitals reviewed.  Constitutional:      General: She is not in acute distress.    Appearance: She is well-developed and well-nourished.  HENT:     Head: Normocephalic and atraumatic.     Mouth/Throat:     Mouth: Oropharynx is clear and moist.  Eyes:     General: No scleral icterus.    Conjunctiva/sclera: Conjunctivae normal.     Pupils: Pupils are equal, round, and reactive to light.  Neck:     Vascular: No JVD.     Trachea: No tracheal deviation.  Cardiovascular:     Rate and Rhythm: Normal rate and regular rhythm.     Pulses: Intact distal pulses.     Heart sounds: Normal heart sounds. No murmur heard.   Pulmonary:     Effort: Pulmonary effort is normal. No tachypnea, accessory muscle usage or respiratory distress.     Breath sounds: Normal breath sounds. No stridor. No wheezing, rhonchi or rales.  Abdominal:     General: Bowel sounds are normal. There is no distension.     Palpations: Abdomen is soft.     Tenderness: There is no abdominal tenderness.  Musculoskeletal:        General: No tenderness or edema.     Cervical back: Neck supple.  Lymphadenopathy:     Cervical: No cervical adenopathy.  Skin:    General: Skin is warm and  dry.     Capillary Refill: Capillary refill takes less than 2 seconds.     Findings: No rash.  Neurological:     Mental Status: She is alert and oriented to person, place, and time.  Psychiatric:        Mood and  Affect: Mood and affect normal.        Behavior: Behavior normal.      Vitals:   10/20/20 1618  BP: 114/80  Pulse: 93  Temp: 98 F (36.7 C)  TempSrc: Tympanic  SpO2: 99%  Weight: 157 lb 4 oz (71.3 kg)  Height: 5\' 4"  (1.626 m)   99% on RA BMI Readings from Last 3 Encounters:  10/20/20 26.99 kg/m  10/04/20 26.43 kg/m  09/07/20 27.09 kg/m   Wt Readings from Last 3 Encounters:  10/20/20 157 lb 4 oz (71.3 kg)  10/04/20 157 lb 9.6 oz (71.5 kg)  09/07/20 157 lb 12.8 oz (71.6 kg)     CBC    Component Value Date/Time   WBC 12.3 (H) 08/05/2019 1609   RBC 4.62 08/05/2019 1609   HGB 14.3 08/05/2019 1609   HCT 42.6 08/05/2019 1609   PLT 324 08/05/2019 1609   MCV 92.2 08/05/2019 1609   MCH 31.0 08/05/2019 1609   MCHC 33.6 08/05/2019 1609   RDW 12.6 08/05/2019 1609   LYMPHSABS 2.0 05/16/2019 0926   MONOABS 0.6 05/16/2019 0926   EOSABS 0.0 05/16/2019 0926   BASOSABS 0.0 05/16/2019 0926    Chest Imaging: Chest x-ray May 16, 2019 Clear, no infiltrate The patient's images have been independently reviewed by me.    Pulmonary Functions Testing Results: PFT Results Latest Ref Rng & Units 10/12/2019 02/23/2016  FVC-Pre L 3.35 3.74  FVC-Predicted Pre % 90 99  FVC-Post L 3.77 3.99  FVC-Predicted Post % 101 106  Pre FEV1/FVC % % 64 67  Post FEV1/FCV % % 68 70  FEV1-Pre L 2.14 2.49  FEV1-Predicted Pre % 73 83  FEV1-Post L 2.56 2.78  DLCO uncorrected ml/min/mmHg 21.34 20.53  DLCO UNC% % 96 78  DLCO corrected ml/min/mmHg - 20.17  DLCO COR %Predicted % - 76  DLVA Predicted % 87 71  TLC L 6.62 6.12  TLC % Predicted % 125 115  RV % Predicted % 160 127    FeNO: None  Pathology: None  Echocardiogram:   IMPRESSIONS  1. Left ventricular ejection fraction, by visual estimation, is 65 to  70%. The left ventricle has normal function. There is no left ventricular  hypertrophy.  2. The left ventricle has no regional wall motion abnormalities.  3. Global right  ventricle has normal systolic function.The right  ventricular size is normal. No increase in right ventricular wall  thickness.  4. Left atrial size was normal.  5. Right atrial size was normal.  6. The mitral valve is normal in structure. Trivial mitral valve  regurgitation. No evidence of mitral stenosis.  7. The tricuspid valve is normal in structure. Tricuspid valve  regurgitation is mild.  8. The aortic valve is normal in structure. Aortic valve regurgitation is  not visualized. No evidence of aortic valve sclerosis or stenosis.  9. The pulmonic valve was normal in structure. Pulmonic valve  regurgitation is not visualized.  10. Normal pulmonary artery systolic pressure.  11. The inferior vena cava is normal in size with greater than 50%  respiratory variability, suggesting right atrial pressure of 3 mmHg.   Heart Catheterization: None    Assessment & Plan:  ICD-10-CM   1. Stage 1 mild COPD by GOLD classification ([place])  J44.9   2. Encounter for smoking cessation counseling  Z71.6   3. History of 2019 novel coronavirus disease (COVID-19)  Z86.16   4. TOBACCO ABUSE  F17.200   5. Multiple lung nodules on CT  R91.8 CT Chest Wo Contrast    Discussion:  This is a 54 year old, current smoker, tapered down to 2 cigarettes a day at this point, plan stop date of November 05, 2020.  History of COVID-19 in July 2020, HRCT with no evidence of post Covid scarring or fibrosis, stage I mild COPD on pulmonary function test.  Here today to review HRCT results we discussed these in detail.  She overall is feeling better and has able to decrease her smoking.  She is on Wellbutrin.  She does believe this is helping with her anxiety.  Plan: Recommend follow-up with PCP or GI regarding her concerns for recurrent epigastric pain chest pains.  She is concerned she may have something going on with her gallbladder as she has had postprandial symptoms. As for her respiratory status recommend  continuation of her current inhaler regimen.  For her lung nodules will follow up with a repeat noncontrasted CT in 1 year. Please see separate documentation for smoking cessation.    Current Outpatient Medications:  .  albuterol (VENTOLIN HFA) 108 (90 Base) MCG/ACT inhaler, INHALE 1 TO 2 PUFFS INTO THE LUNGS EVERY6 HOURS AS NEEDED FOR WHEEZING OR SHORTNESS OF BREATH, Disp: 8.5 g, Rfl: 11 .  ALPRAZolam (XANAX) 0.25 MG tablet, Take 1 tablet (0.25 mg total) by mouth daily as needed for anxiety., Disp: 30 tablet, Rfl: 0 .  aspirin EC 81 MG tablet, Take 81 mg by mouth daily., Disp: , Rfl:  .  buPROPion (WELLBUTRIN XL) 150 MG 24 hr tablet, Take 1 tablet (150 mg total) by mouth daily., Disp: 30 tablet, Rfl: 2 .  cholecalciferol (VITAMIN D3) 25 MCG (1000 UT) tablet, Take 1,000 Units by mouth daily., Disp: , Rfl:  .  diphenhydrAMINE (BENADRYL) 25 MG tablet, Take 25 mg by mouth 2 (two) times daily as needed for allergies., Disp: , Rfl:  .  FLUoxetine (PROZAC) 20 MG capsule, Take 1 capsule (20 mg total) by mouth daily., Disp: 30 capsule, Rfl: 0 .  Fluticasone-Umeclidin-Vilant (TRELEGY ELLIPTA) 100-62.5-25 MCG/INH AEPB, Inhale 1 puff into the lungs daily., Disp: 28 each, Rfl: 6 .  ibuprofen (ADVIL,MOTRIN) 200 MG tablet, Take 800 mg by mouth every 6 (six) hours as needed for headache (pain)., Disp: , Rfl:  .  omeprazole (PRILOSEC) 40 MG capsule, Take 1 capsule (40 mg total) by mouth daily before breakfast., Disp: 30 capsule, Rfl: 3   I spent 30 minutes dedicated to the care of this patient on the date of this encounter to include pre-visit review of records, face-to-face time with the patient discussing conditions above, post visit ordering of testing, clinical documentation with the electronic health record, making appropriate referrals as documented, and communicating necessary findings to members of the patients care team.    Garner Nash, DO [facility] 10/20/2020 4:24 PM

## 2020-10-20 NOTE — Patient Instructions (Signed)
Thank you for visiting Dr. Valeta Harms at [facility]. Today we recommend the following:  Follow up with PCP regarding belly issues  Congratulation on quitting smoking January 1st.   Return in about 1 year (around 10/20/2021) for with APP or Dr. Valeta Harms.    Please do your part to reduce the spread of COVID-19.   You must quit smoking or vaping. This is the single most important thing that you can do to improve your lung health.   S = Set a quit date. T = Tell family, friends, and the people around you that you plan to quit. A = Anticipate or plan ahead for the tough times you'll face while quitting. R = Remove cigarettes and other tobacco products from your home, car, and work T = Talk to [place] about getting help to quit  If you need help feel free to reach out to our office, [facility]: [phone], call 1-800-QUIT-NOW, or visit [email or web address]

## 2020-12-26 ENCOUNTER — Ambulatory Visit: Payer: Commercial Managed Care - PPO | Admitting: Family Medicine

## 2020-12-27 ENCOUNTER — Ambulatory Visit: Payer: Commercial Managed Care - PPO | Admitting: Family Medicine

## 2020-12-28 ENCOUNTER — Ambulatory Visit (INDEPENDENT_AMBULATORY_CARE_PROVIDER_SITE_OTHER): Payer: Commercial Managed Care - PPO | Admitting: Family Medicine

## 2020-12-28 ENCOUNTER — Other Ambulatory Visit: Payer: Self-pay

## 2020-12-28 ENCOUNTER — Encounter: Payer: Self-pay | Admitting: Family Medicine

## 2020-12-28 VITALS — BP 116/80 | HR 83 | Temp 97.1°F | Ht 64.0 in | Wt 156.0 lb

## 2020-12-28 DIAGNOSIS — F419 Anxiety disorder, unspecified: Secondary | ICD-10-CM

## 2020-12-28 DIAGNOSIS — F17200 Nicotine dependence, unspecified, uncomplicated: Secondary | ICD-10-CM

## 2020-12-28 DIAGNOSIS — T881XXA Other complications following immunization, not elsewhere classified, initial encounter: Secondary | ICD-10-CM

## 2020-12-28 DIAGNOSIS — B001 Herpesviral vesicular dermatitis: Secondary | ICD-10-CM

## 2020-12-28 DIAGNOSIS — F339 Major depressive disorder, recurrent, unspecified: Secondary | ICD-10-CM | POA: Diagnosis not present

## 2020-12-28 MED ORDER — FLUOXETINE HCL 20 MG PO CAPS: 20.0000 mg | ORAL_CAPSULE | Freq: Every day | ORAL | 11 refills | Status: DC

## 2020-12-28 MED ORDER — VALACYCLOVIR HCL 1 G PO TABS: 2000.0000 mg | ORAL_TABLET | Freq: Two times a day (BID) | ORAL | 1 refills | Status: DC

## 2020-12-28 NOTE — Progress Notes (Signed)
Subjective:    Patient ID: Sophia Vance, female    DOB: 1965/12/08, 55 y.o.   MRN: [id number]  This visit occurred during the SARS-CoV-2 public health emergency.  Safety protocols were in place, including screening questions prior to the visit, additional usage of staff PPE, and extensive cleaning of exam room while observing appropriate contact time as indicated for disinfecting solutions.    HPI 55 yo pt of NP Carlean Purl presents with questions about medicine and fever blister and knot on R arm   Wt Readings from Last 3 Encounters:  12/28/20 156 lb (70.8 kg)  10/20/20 157 lb 4 oz (71.3 kg)  10/04/20 157 lb 9.6 oz (71.5 kg)   26.78 kg/m  Gyn changed her prozac to wellbutrin in November to see if this helps her depression more and may help quit smoking At that time she weaned off prozac and then got worse again  prozac 20 mg daily  wellbutrin xl 150 mg daily  Has prn xanax   She was irritable/anxious in the interim (also lot of stress)   The wellbutrin did help her with smoking  Is down to a few cigarettes daily   Had counseling in the past - does not want to pursue that now  Does not sleep very well but also not active   Wants to work on incorporating exercise  With spring will be easier  Likes to walk  Considering yoga with friends in garage   Seeing chiropractor for "knots" in her back   Cold sores  Woke up last week with a fever blister   They seem to be coming back after years of not getting them  Always on lips   Patient Active Problem List   Diagnosis Date Noted  . Recurrent cold sores 12/28/2020  . Local reaction to COVID-19 vaccine 12/28/2020  . GERD (gastroesophageal reflux disease) 08/19/2019  . CHRONIC OBSTRUCTIVE PULMONARY DISEASE 09/21/2010  . TOBACCO ABUSE 09/07/2010  . Depression, recurrent ([place]) 09/07/2010  . Dyspnea on exertion 09/07/2010  . CHEST PAIN, ATYPICAL, HX OF 09/07/2010   Past Medical History:  Diagnosis Date  . Anxiety   .  Chest pain   . COPD (chronic obstructive pulmonary disease) ([place])   . COVID-19 05/2019   Past Surgical History:  Procedure Laterality Date  . APPENDECTOMY    . BREAST SURGERY     Biopsy neg.   Social History   Tobacco Use  . Smoking status: Current Every Day Smoker    Packs/day: 0.25    Years: 35.00    Pack years: 8.75    Types: Cigarettes  . Smokeless tobacco: Never Used  . Tobacco comment: 5 ciggs a day  Vaping Use  . Vaping Use: Former  Substance Use Topics  . Alcohol use: Yes    Alcohol/week: 0.0 standard drinks    Comment: Rare  . Drug use: No   Family History  Problem Relation Age of Onset  . Osteoarthritis Mother   . Heart failure Mother   . Hypertension Mother   . COPD Mother   . Breast cancer Mother   . Cancer Father        Throat, lung, brain  . Heart attack Maternal Grandfather   . Kidney failure Maternal Grandmother   . Breast cancer Paternal Grandmother   . Colon cancer Neg Hx   . Esophageal cancer Neg Hx   . Stomach cancer Neg Hx   . Rectal cancer Neg Hx    No Known Allergies  Current Outpatient Medications on File Prior to Visit  Medication Sig Dispense Refill  . albuterol (VENTOLIN HFA) 108 (90 Base) MCG/ACT inhaler INHALE 1 TO 2 PUFFS INTO THE LUNGS EVERY6 HOURS AS NEEDED FOR WHEEZING OR SHORTNESS OF BREATH 8.5 g 11  . ALPRAZolam (XANAX) 0.25 MG tablet Take 1 tablet (0.25 mg total) by mouth daily as needed for anxiety. 30 tablet 0  . aspirin EC 81 MG tablet Take 81 mg by mouth daily.    Marland Kitchen buPROPion (WELLBUTRIN XL) 150 MG 24 hr tablet Take 1 tablet (150 mg total) by mouth daily. 30 tablet 2  . cholecalciferol (VITAMIN D3) 25 MCG (1000 UT) tablet Take 1,000 Units by mouth daily.    . diphenhydrAMINE (BENADRYL) 25 MG tablet Take 25 mg by mouth 2 (two) times daily as needed for allergies.    Marland Kitchen ibuprofen (ADVIL,MOTRIN) 200 MG tablet Take 800 mg by mouth every 6 (six) hours as needed for headache (pain).    Marland Kitchen omeprazole (PRILOSEC) 40 MG capsule Take 1  capsule (40 mg total) by mouth daily before breakfast. 30 capsule 3   No current facility-administered medications on file prior to visit.     Review of Systems  Constitutional: Positive for fatigue. Negative for activity change, appetite change, fever and unexpected weight change.  HENT: Negative for congestion, ear pain, rhinorrhea, sinus pressure and sore throat.   Eyes: Negative for pain, redness and visual disturbance.  Respiratory: Negative for cough, shortness of breath and wheezing.   Cardiovascular: Negative for chest pain and palpitations.  Gastrointestinal: Negative for abdominal pain, blood in stool, constipation and diarrhea.  Endocrine: Negative for polydipsia and polyuria.  Genitourinary: Negative for dysuria, frequency and urgency.  Musculoskeletal: Negative for arthralgias, back pain and myalgias.  Skin: Negative for pallor and rash.  Allergic/Immunologic: Negative for environmental allergies.  Neurological: Negative for dizziness, syncope and headaches.  Hematological: Negative for adenopathy. Does not bruise/bleed easily.  Psychiatric/Behavioral: Positive for dysphoric mood and sleep disturbance. Negative for decreased concentration, self-injury and suicidal ideas. The patient is nervous/anxious.        Objective:   Physical Exam Constitutional:      General: She is not in acute distress.    Appearance: Normal appearance. She is well-developed, normal weight and well-nourished. She is not ill-appearing or diaphoretic.  HENT:     Head: Normocephalic and atraumatic.     Mouth/Throat:     Mouth: Oropharynx is clear and moist.  Eyes:     Extraocular Movements: EOM normal.     Conjunctiva/sclera: Conjunctivae normal.     Pupils: Pupils are equal, round, and reactive to light.  Neck:     Thyroid: No thyromegaly.     Vascular: No carotid bruit or JVD.  Cardiovascular:     Rate and Rhythm: Normal rate and regular rhythm.     Pulses: Intact distal pulses.      Heart sounds: Normal heart sounds. No gallop.   Pulmonary:     Effort: Pulmonary effort is normal. No respiratory distress.     Breath sounds: Normal breath sounds. No wheezing or rales.     Comments: No crackles Abdominal:     General: Abdomen is flat. There is no abdominal bruit.  Musculoskeletal:        General: No edema.     Cervical back: Normal range of motion and neck supple.  Lymphadenopathy:     Cervical: No cervical adenopathy.  Skin:    General: Skin is warm and dry.  Coloration: Skin is not pale.     Findings: No erythema or rash.     Comments: Small healing cold sore on middle upper lip  Solar lentigines diffusely   Soft area of induration on R upper arm w/o erythema  No rash   Neurological:     Mental Status: She is alert.     Cranial Nerves: No cranial nerve deficit.     Motor: No weakness.     Gait: Gait normal.     Deep Tendon Reflexes: Reflexes are normal and symmetric. Reflexes normal.     Comments: Mild hand tremor   Psychiatric:        Attention and Perception: Attention normal.        Mood and Affect: Mood is depressed.        Speech: Speech normal.        Behavior: Behavior normal.        Thought Content: Thought content normal.        Cognition and Memory: Cognition and memory normal.     Comments: Pleasant  Discusses symptoms candidly           Assessment & Plan:   Problem List Items Addressed This Visit      Digestive   Recurrent cold sores    Px valtrex to use 2 g bid for one day prn cold sores        Relevant Medications   valACYclovir (VALTREX) 1000 MG tablet     Other   TOBACCO ABUSE    Disc in detail risks of smoking and possible outcomes including copd, vascular/ heart disease, cancer , respiratory and sinus infections  Pt voices understanding Commended on cutting back  wellbutrin has helped also      Depression, recurrent ([place]) - Primary    Now on combination of fluoxitine 20 mg daily and wellbutrin xl 150 mg daily   Doing better and able to cut back cigarettes  Sees gyn for this as well  Plan to continue this and alert [place] if no further improvement Reviewed stressors/ coping techniques/symptoms/ support sources/ tx options and side effects in detail today       Relevant Medications   FLUoxetine (PROZAC) 20 MG capsule   Local reaction to COVID-19 vaccine    Small swollen area on R upper arm at site of vaccination  No rash or other symptoms Reassured and will plan to obs        Other Visit Diagnoses    Anxiety       Relevant Medications   FLUoxetine (PROZAC) 20 MG capsule

## 2020-12-28 NOTE — Assessment & Plan Note (Signed)
Px valtrex to use 2 g bid for one day prn cold sores

## 2020-12-28 NOTE — Assessment & Plan Note (Signed)
Small swollen area on R upper arm at site of vaccination  No rash or other symptoms Reassured and will plan to obs

## 2020-12-28 NOTE — Assessment & Plan Note (Signed)
Disc in detail risks of smoking and possible outcomes including copd, vascular/ heart disease, cancer , respiratory and sinus infections  Pt voices understanding Commended on cutting back  wellbutrin has helped also

## 2020-12-28 NOTE — Assessment & Plan Note (Signed)
Now on combination of fluoxitine 20 mg daily and wellbutrin xl 150 mg daily  Doing better and able to cut back cigarettes  Sees gyn for this as well  Plan to continue this and alert [place] if no further improvement Reviewed stressors/ coping techniques/symptoms/ support sources/ tx options and side effects in detail today

## 2020-12-28 NOTE — Patient Instructions (Addendum)
Take wellbutrin in the am  prozac in am or pm  Let us know if you don't continue to feel better   Good luck quitting smoking   Take care of yourself  Try to add in some exercise   Use a cold compress on your arm  Let us know if that worsens

## 2021-01-11 ENCOUNTER — Other Ambulatory Visit: Payer: Self-pay | Admitting: Family Medicine

## 2021-01-11 DIAGNOSIS — F419 Anxiety disorder, unspecified: Secondary | ICD-10-CM

## 2021-01-11 NOTE — Telephone Encounter (Signed)
GYN filled med last on 10/04/20 #30 with 2 refills, pt saw Dr. Glori Bickers for acute med question on 12/28/20  Will route to Dr. Glori Bickers to see if she is okay filling med until pt gets est with a new PCP

## 2021-02-20 ENCOUNTER — Other Ambulatory Visit: Payer: Self-pay | Admitting: Nurse Practitioner

## 2021-02-20 DIAGNOSIS — K219 Gastro-esophageal reflux disease without esophagitis: Secondary | ICD-10-CM

## 2021-02-21 ENCOUNTER — Telehealth: Payer: Self-pay

## 2021-02-21 NOTE — Telephone Encounter (Signed)
Patient's pharmacy requested refill on Omeprazole.  10/04/20 office visit note "Gastroesophageal reflux disease without esophagitis - Plan: omeprazole (PRILOSEC) 40 MG capsule daily.  Reports severe acid reflux that causes her to spit up daily.  It is likely that this is related to anxiety/stress.  Provided her with refills for 4 months and recommend follow-up with GI if she requires more than this."  Patient called and I spoke with her about TW's recommendation.  She had forgotten about it. She said she still has reflux while taking Omeprazole. I provided her with the name of the GI who performed her endoscopy and colonoscopy less than 2 years ago and she said she will call there. She asked if TW would refill Omeprazole for one more month until she can see him.   Also, she wanted TW to know that went to PCP and she kept her on both anxiety medications Wellbutrin and Prozac.  She said she has almost completely stopped smoking.

## 2021-02-21 NOTE — Telephone Encounter (Signed)
Spoke with patient and let her know Rx refilled for one month to give her time to see GI MD.

## 2021-02-21 NOTE — Telephone Encounter (Addendum)
Opened in error.  See 02/20/21 refill request for this encounter note.

## 2021-02-21 NOTE — Telephone Encounter (Signed)
Message left to return call to Sophia Vance at [phone].   

## 2021-03-21 ENCOUNTER — Ambulatory Visit (INDEPENDENT_AMBULATORY_CARE_PROVIDER_SITE_OTHER): Payer: Commercial Managed Care - PPO | Admitting: Gastroenterology

## 2021-03-21 ENCOUNTER — Encounter: Payer: Self-pay | Admitting: Gastroenterology

## 2021-03-21 VITALS — BP 130/70 | HR 84 | Ht 64.0 in | Wt 156.2 lb

## 2021-03-21 DIAGNOSIS — K219 Gastro-esophageal reflux disease without esophagitis: Secondary | ICD-10-CM | POA: Diagnosis not present

## 2021-03-21 DIAGNOSIS — K5909 Other constipation: Secondary | ICD-10-CM | POA: Diagnosis not present

## 2021-03-21 DIAGNOSIS — R1319 Other dysphagia: Secondary | ICD-10-CM | POA: Diagnosis not present

## 2021-03-21 MED ORDER — PANTOPRAZOLE SODIUM 40 MG PO TBEC: 40.0000 mg | DELAYED_RELEASE_TABLET | Freq: Every day | ORAL | 0 refills | Status: DC

## 2021-03-21 NOTE — Progress Notes (Signed)
[facility] Progress Note  Chief Complaint: GERD and change in bowel habits  Subjective  History: Sophia Vance was initially seen October 2020 to discuss colon cancer screening and GERD.  At that time, she was having heartburn, indigestion and both solid and liquid dysphagia.  She experienced regurgitation and had a globus sensation.  Smoking and caffeine intake were felt to be contributing factors.  Omeprazole was increased from 20 mg daily to 40 mg daily. Colonoscopy on 08/25/2019 was normal except for a 4 mm right colon tubular adenoma without high-grade dysplasia. EGD the same day was a normal study.  Specifically, no stricture, esophagitis or Barrett's esophagus seen.  Recommendation was to engage in GERD related diet and lifestyle measures, particularly smoking cessation and decreasing caffeine intake.  If symptoms were stable for the next couple of months, plan was to wean off omeprazole if possible.  Dysphagia was felt to be reflux related dysmotility. ___________________ Sophia Vance describes similar symptoms as before, with regurgitation, pyrosis and solid and liquid dysphagia.  That has been going on several months, and she had not had any clear triggers for the recurrence of the symptoms.  In the last several weeks she has noticed a decrease in frequency of bowel movements.  She took a hydrogen peroxide "cleanse" over the weekend, says it was "just a few drops".  (I recommended that she not continue that because we do not know the safety of doing so)  She denies rectal bleeding or abdominal pain.  Appetite has been good and weight stable Lately has taken a few tablets of a friend's pantoprazole, which helped relieve her upper digestive symptoms. ROS: Cardiovascular:  no chest pain Respiratory: no dyspnea Mood stable The patient's Past Medical, Family and Social History were reviewed and are on file in the EMR. PCP visit from 12/28/20 reviewed re: depression .  wellbutrin added Nov 2021  by gyn  Shx:  One cigarette every few days Caffeine daily  Objective:  Med list reviewed  Current Outpatient Medications:  .  albuterol (VENTOLIN HFA) 108 (90 Base) MCG/ACT inhaler, INHALE 1 TO 2 PUFFS INTO THE LUNGS EVERY6 HOURS AS NEEDED FOR WHEEZING OR SHORTNESS OF BREATH, Disp: 8.5 g, Rfl: 11 .  ALPRAZolam (XANAX) 0.25 MG tablet, Take 1 tablet (0.25 mg total) by mouth daily as needed for anxiety., Disp: 30 tablet, Rfl: 0 .  aspirin EC 81 MG tablet, Take 81 mg by mouth daily., Disp: , Rfl:  .  buPROPion (WELLBUTRIN XL) 150 MG 24 hr tablet, TAKE 1 TABLET BY MOUTH ONCE A DAY, Disp: 30 tablet, Rfl: 5 .  cholecalciferol (VITAMIN D3) 25 MCG (1000 UT) tablet, Take 1,000 Units by mouth daily., Disp: , Rfl:  .  diphenhydrAMINE (BENADRYL) 25 MG tablet, Take 25 mg by mouth 2 (two) times daily as needed for allergies., Disp: , Rfl:  .  FLUoxetine (PROZAC) 20 MG capsule, Take 1 capsule (20 mg total) by mouth daily., Disp: 30 capsule, Rfl: 11 .  ibuprofen (ADVIL,MOTRIN) 200 MG tablet, Take 800 mg by mouth every 6 (six) hours as needed for headache (pain)., Disp: , Rfl:  .  pantoprazole (PROTONIX) 40 MG tablet, Take 1 tablet (40 mg total) by mouth daily., Disp: 90 tablet, Rfl: 0 .  valACYclovir (VALTREX) 1000 MG tablet, Take 2 tablets (2,000 mg total) by mouth 2 (two) times daily. For one day for a cold sore, Disp: 12 tablet, Rfl: 1    Vital signs in last 24 hrs: Vitals:   03/21/21  0923  BP: 130/70  Pulse: 84   Wt Readings from Last 3 Encounters:  03/21/21 156 lb 3.2 oz (70.9 kg)  12/28/20 156 lb (70.8 kg)  10/20/20 157 lb 4 oz (71.3 kg)    Physical Exam  No dysphonia  HEENT: sclera anicteric, oral mucosa moist without lesions  Neck: supple, no thyromegaly, JVD or lymphadenopathy  Cardiac: RRR without murmurs, S1S2 heard, no peripheral edema  Pulm: clear to auscultation bilaterally, normal RR and effort noted  Abdomen: soft, no tenderness, with active bowel sounds. No guarding or  palpable hepatosplenomegaly.  Skin; warm and dry, no jaundice or rash  Labs:   ___________________________________________ Radiologic studies:   ____________________________________________ Other:   _____________________________________________ Assessment & Plan  Assessment: Encounter Diagnoses  Name Primary?  . Gastroesophageal reflux disease without esophagitis Yes  . Esophageal dysphagia   . Chronic constipation    Sophia Vance has recurrence of GERD symptoms, and I suspect her dysphagia is again a reflux related dysmotility as before.  She could have developed a stricture since the last upper endoscopy. Recent onset constipation.  Says she gets enough dietary fiber and water.  Plan: We we discussed GERD and the limitation of acid suppression therapy, breadth of diet and lifestyle changes required for reflux control, the possibility of reflux related complications such as esophagitis, stricture or Barrett's esophagus.  Other anatomic considerations such as gastric outlet obstruction or hiatal hernia may contribute to reflux. (Neither Barrett's nor hiatal hernia seen on last EGD) Try to quit smoking altogether and decrease caffeine intake.  Pantoprazole 40 mg daily for 90 days  Barium swallow with liquid and tablet to rule out stricture.  If none, upper endoscopy most likely would not be necessary at this point.  She preferred some natural remedy for constipation, so I recommended 200 mg of colace every evening and 400 mg magnesium tablet every morning.  If insufficient relief, she can change to MiraLAX, 1/2-1 full capful a day and 16 ounces of water.  Clinic follow-up will be arranged after test result.   36 minutes were spent on this encounter (including chart review, history/exam, counseling/coordination of care, and documentation) > 50% of that time was spent on counseling and coordination of care.  Topics discussed included: See above.  Nelida Meuse III

## 2021-03-21 NOTE — Patient Instructions (Addendum)
If you are age 53 or older, your body mass index should be between 23-30. Your Body mass index is 24.15 kg/m. If this is out of the aforementioned range listed, please consider follow up with your Primary Care Provider.  If you are age 48 or younger, your body mass index should be between 19-25. Your Body mass index is 24.15 kg/m. If this is out of the aformentioned range listed, please consider follow up with your Primary Care Provider.   Please start the Colace 200mg  at bedtime. Please start the Magnesium 400mg  once a day Please start the pantoprazole  It was a pleasure to see you today!  Thank you for trusting me with your gastrointestinal care!     Gastroesophageal Reflux Disease, Adult  Gastroesophageal reflux (GER) happens when acid from the stomach flows up into the tube that connects the mouth and the stomach (esophagus). Normally, food travels down the esophagus and stays in the stomach to be digested. With GER, food and stomach acid sometimes move back up into the esophagus. You may have a disease called gastroesophageal reflux disease (GERD) if the reflux:  Happens often.  Causes frequent or very bad symptoms.  Causes problems such as damage to the esophagus. When this happens, the esophagus becomes sore and swollen. Over time, GERD can make small holes (ulcers) in the lining of the esophagus. What are the causes? This condition is caused by a problem with the muscle between the esophagus and the stomach. When this muscle is weak or not normal, it does not close properly to keep food and acid from coming back up from the stomach. The muscle can be weak because of:  Tobacco use.  Pregnancy.  Having a certain type of hernia (hiatal hernia).  Alcohol use.  Certain foods and drinks, such as coffee, chocolate, onions, and peppermint. What increases the risk?  Being overweight.  Having a disease that affects your connective tissue.  Taking NSAIDs, such a  ibuprofen. What are the signs or symptoms?  Heartburn.  Difficult or painful swallowing.  The feeling of having a lump in the throat.  A bitter taste in the mouth.  Bad breath.  Having a lot of saliva.  Having an upset or bloated stomach.  Burping.  Chest pain. Different conditions can cause chest pain. Make sure you see your doctor if you have chest pain.  Shortness of breath or wheezing.  A long-term cough or a cough at night.  Wearing away of the surface of teeth (tooth enamel).  Weight loss. How is this treated?  Making changes to your diet.  Taking medicine.  Having surgery. Treatment will depend on how bad your symptoms are. Follow these instructions at home: Eating and drinking  Follow a diet as told by your doctor. You may need to avoid foods and drinks such as: ? Coffee and tea, with or without caffeine. ? Drinks that contain alcohol. ? Energy drinks and sports drinks. ? Bubbly (carbonated) drinks or sodas. ? Chocolate and cocoa. ? Peppermint and mint flavorings. ? Garlic and onions. ? Horseradish. ? Spicy and acidic foods. These include peppers, chili powder, curry powder, vinegar, hot sauces, and BBQ sauce. ? Citrus fruit juices and citrus fruits, such as oranges, lemons, and limes. ? Tomato-based foods. These include red sauce, chili, salsa, and pizza with red sauce. ? Fried and fatty foods. These include donuts, french fries, potato chips, and high-fat dressings. ? High-fat meats. These include hot dogs, rib eye steak, sausage, ham, and bacon. ?  High-fat dairy items, such as whole milk, butter, and cream cheese.  Eat small meals often. Avoid eating large meals.  Avoid drinking large amounts of liquid with your meals.  Avoid eating meals during the 2-3 hours before bedtime.  Avoid lying down right after you eat.  Do not exercise right after you eat.   Lifestyle  Do not smoke or use any products that contain nicotine or tobacco. If you need  help quitting, ask your doctor.  Try to lower your stress. If you need help doing this, ask your doctor.  If you are overweight, lose an amount of weight that is healthy for you. Ask your doctor about a safe weight loss goal.   General instructions  Pay attention to any changes in your symptoms.  Take over-the-counter and prescription medicines only as told by your doctor.  Do not take aspirin, ibuprofen, or other NSAIDs unless your doctor says it is okay.  Wear loose clothes. Do not wear anything tight around your waist.  Raise (elevate) the head of your bed about 6 inches (15 cm). You may need to use a wedge to do this.  Avoid bending over if this makes your symptoms worse.  Keep all follow-up visits. Contact a doctor if:  You have new symptoms.  You lose weight and you do not know why.  You have trouble swallowing or it hurts to swallow.  You have wheezing or a cough that keeps happening.  You have a hoarse voice.  Your symptoms do not get better with treatment. Get help right away if:  You have sudden pain in your arms, neck, jaw, teeth, or back.  You suddenly feel sweaty, dizzy, or light-headed.  You have chest pain or shortness of breath.  You vomit and the vomit is green, yellow, or black, or it looks like blood or coffee grounds.  You faint.  Your poop (stool) is red, bloody, or black.  You cannot swallow, drink, or eat. These symptoms may represent a serious problem that is an emergency. Do not wait to see if the symptoms will go away. Get medical help right away. Call your local emergency services (911 in the [place] Do not drive yourself to the hospital. Summary  If a person has gastroesophageal reflux disease (GERD), food and stomach acid move back up into the esophagus and cause symptoms or problems such as damage to the esophagus.  Treatment will depend on how bad your symptoms are.  Follow a diet as told by your doctor.  Take all medicines only  as told by your doctor. This information is not intended to replace advice given to you by your health care provider. Make sure you discuss any questions you have with your health care provider. Document Revised: 05/02/2020 Document Reviewed: 05/02/2020 Elsevier Patient Education  2021 [place] for Gastroesophageal Reflux Disease, Adult When you have gastroesophageal reflux disease (GERD), the foods you eat and your eating habits are very important. Choosing the right foods can help ease your discomfort. Think about working with a food expert (dietitian) to help you make good choices. What are tips for following this plan? Reading food labels  Look for foods that are low in saturated fat. Foods that may help with your symptoms include: ? Foods that have less than 5% of daily value (DV) of fat. ? Foods that have 0 grams of trans fat. Cooking  Do not fry your food.  Cook your food by baking, steaming, grilling, or broiling.  These are all methods that do not need a lot of fat for cooking.  To add flavor, try to use herbs that are low in spice and acidity. Meal planning  Choose healthy foods that are low in fat, such as: ? Fruits and vegetables. ? Whole grains. ? Low-fat dairy products. ? Lean meats, fish, and poultry.  Eat small meals often instead of eating 3 large meals each day. Eat your meals slowly in a place where you are relaxed. Avoid bending over or lying down until 2-3 hours after eating.  Limit high-fat foods such as fatty meats or fried foods.  Limit your intake of fatty foods, such as oils, butter, and shortening.  Avoid the following as told by your doctor: ? Foods that cause symptoms. These may be different for different people. Keep a food diary to keep track of foods that cause symptoms. ? Alcohol. ? Drinking a lot of liquid with meals. ? Eating meals during the 2-3 hours before bed.   Lifestyle  Stay at a healthy weight. Ask your doctor what  weight is healthy for you. If you need to lose weight, work with your doctor to do so safely.  Exercise for at least 30 minutes on 5 or more days each week, or as told by your doctor.  Wear loose-fitting clothes.  Do not smoke or use any products that contain nicotine or tobacco. If you need help quitting, ask your doctor.  Sleep with the head of your bed higher than your feet. Use a wedge under the mattress or blocks under the bed frame to raise the head of the bed.  Chew sugar-free gum after meals. What foods should eat? Eat a healthy, well-balanced diet of fruits, vegetables, whole grains, low-fat dairy products, lean meats, fish, and poultry. Each person is different. Foods that may cause symptoms in one person may not cause any symptoms in another person. Work with your doctor to find foods that are safe for you. The items listed above may not be a complete list of what you can eat and drink. Contact a food expert for more options.   What foods should I avoid? Limiting some of these foods may help in managing the symptoms of GERD. Everyone is different. Talk with a food expert or your doctor to help you find the exact foods to avoid, if any. Fruits Any fruits prepared with added fat. Any fruits that cause symptoms. For some people, this may include citrus fruits, such as oranges, grapefruit, pineapple, and lemons. Vegetables Deep-fried vegetables. [place] fries. Any vegetables prepared with added fat. Any vegetables that cause symptoms. For some people, this may include tomatoes and tomato products, chili peppers, onions and garlic, and horseradish. Grains Pastries or quick breads with added fat. Meats and other proteins High-fat meats, such as fatty beef or pork, hot dogs, ribs, ham, sausage, salami, and bacon. Fried meat or protein, including fried fish and fried chicken. Nuts and nut butters, in large amounts. Dairy Whole milk and chocolate milk. Sour cream. Cream. Ice cream. Cream  cheese. Milkshakes. Fats and oils Butter. Margarine. Shortening. Ghee. Beverages Coffee and tea, with or without caffeine. Carbonated beverages. Sodas. Energy drinks. Fruit juice made with acidic fruits, such as orange or grapefruit. Tomato juice. Alcoholic drinks. Sweets and desserts Chocolate and cocoa. Donuts. Seasonings and condiments Pepper. Peppermint and spearmint. Added salt. Any condiments, herbs, or seasonings that cause symptoms. For some people, this may include curry, hot sauce, or vinegar-based salad dressings. The items listed  above may not be a complete list of what you should not eat and drink. Contact a food expert for more options. Questions to ask your doctor Diet and lifestyle changes are often the first steps that are taken to manage symptoms of GERD. If diet and lifestyle changes do not help, talk with your doctor about taking medicines. Where to find more information  International Foundation for Gastrointestinal Disorders: aboutgerd.org Summary  When you have GERD, food and lifestyle choices are very important in easing your symptoms.  Eat small meals often instead of 3 large meals a day. Eat your meals slowly and in a place where you are relaxed.  Avoid bending over or lying down until 2-3 hours after eating.  Limit high-fat foods such as fatty meats or fried foods. This information is not intended to replace advice given to you by your health care provider. Make sure you discuss any questions you have with your health care provider. Document Revised: 05/02/2020 Document Reviewed: 05/02/2020 Elsevier Patient Education  [place].

## 2021-05-25 ENCOUNTER — Other Ambulatory Visit: Payer: Self-pay | Admitting: Pulmonary Disease

## 2021-05-25 NOTE — Telephone Encounter (Signed)
Called and spoke with patient, she states when she last saw Dr. Valeta Harms in December of 2021, he told her she did not have to use her daily inhaler everyday and could probably go off of it.  She went completely off of it and now feels that was a bad idea.  She has been off of the Trelegy Ellipta 100 for a couple of months and is now wheezing.  I reviewed the last office visit and Dr. Valeta Harms stated under plan:  As for her respiratory status recommend continuation of her current inhaler regimen.  She was on Albuterol 108 (90) base inhaler as needed and Trelegy 100 daily.  I verified pharmacy.  Advised patient to call the office back if restarting the inhaler did not resolve the wheezing.  She verbalized understanding.    As I was placing the order for the Trelegy 100, received a message that it is not covered under her insurance.    The covered inhalers are: Wixela inhub 100-50, 250-50, 500-50 Fluticasone-salmeterol:  100-50, 250-50, 500-50  Tammy,  Do you want to change the inhaler since the Trelegy is not covered?  Please advise.  Thank you.

## 2021-06-13 MED ORDER — TRELEGY ELLIPTA 100-62.5-25 MCG/INH IN AEPB: 1.0000 | INHALATION_SPRAY | Freq: Every day | RESPIRATORY_TRACT | 11 refills | Status: DC

## 2021-06-13 NOTE — Telephone Encounter (Signed)
Refill submitted [facility]  

## 2021-06-20 ENCOUNTER — Other Ambulatory Visit: Payer: Self-pay | Admitting: Family Medicine

## 2021-06-20 NOTE — Telephone Encounter (Signed)
Last filled on 12/28/20 #12 tabs with 1 refill, last OV was on 12/28/20

## 2021-06-27 ENCOUNTER — Other Ambulatory Visit: Payer: Self-pay | Admitting: Gastroenterology

## 2021-06-30 NOTE — Telephone Encounter (Signed)
Trelegy 100 was sent.

## 2021-07-31 ENCOUNTER — Other Ambulatory Visit: Payer: Self-pay | Admitting: Family

## 2021-07-31 ENCOUNTER — Other Ambulatory Visit: Payer: Self-pay | Admitting: Family Medicine

## 2021-07-31 DIAGNOSIS — F419 Anxiety disorder, unspecified: Secondary | ICD-10-CM

## 2021-08-02 NOTE — Telephone Encounter (Signed)
Called mrs. Leiann to set up [facility] appt and left vm

## 2021-08-02 NOTE — Telephone Encounter (Signed)
Med question appt on 12/27/20 with Dr. Glori Bickers. Will route to Dr. Glori Bickers to advise on refill and will also route to the support pool to see if pt wants to est care with Catalina Antigua, NP

## 2021-08-04 ENCOUNTER — Encounter: Payer: Self-pay | Admitting: Family Medicine

## 2021-08-04 NOTE — Telephone Encounter (Signed)
Sent mychart message

## 2021-09-08 ENCOUNTER — Encounter: Payer: Self-pay | Admitting: Obstetrics and Gynecology

## 2021-09-08 ENCOUNTER — Ambulatory Visit (INDEPENDENT_AMBULATORY_CARE_PROVIDER_SITE_OTHER): Payer: Commercial Managed Care - PPO | Admitting: Obstetrics and Gynecology

## 2021-09-08 ENCOUNTER — Other Ambulatory Visit: Payer: Self-pay

## 2021-09-08 VITALS — BP 124/76

## 2021-09-08 DIAGNOSIS — N3281 Overactive bladder: Secondary | ICD-10-CM | POA: Diagnosis not present

## 2021-09-08 DIAGNOSIS — R3915 Urgency of urination: Secondary | ICD-10-CM

## 2021-09-08 DIAGNOSIS — N951 Menopausal and female climacteric states: Secondary | ICD-10-CM

## 2021-09-08 NOTE — Progress Notes (Signed)
GYNECOLOGY  VISIT   HPI: 55 y.o.   Married White or Caucasian Not Hispanic or Latino  female   G1P1 with Patient's last menstrual period was 03/18/2019 (approximate).   here for hot flashes. LMP over 2 years ago. Hot flashes have gotten worse with time. Having 3-4 hot flashes in an 8 hour work day. She is having night sweats, not as bad as they used to be. She does wake up at least 2 x a night because she is hot. No mood changes. She is on Prozac.  Not sexually active, no vaginal dryness.   She tried OTC agents, helped some.   She is on Wellbutrin, started to quit smoking. Quit smoking 5 months ago.   No regular exercise.   She c/o urinary urgency, frequency, goes small amounts. Leaks a small amount daily. Getting worse with time.   Daughter is 74.     GYNECOLOGIC HISTORY: Patient's last menstrual period was 03/18/2019 (approximate). Contraception:none Menopausal hormone therapy: none        OB History     Gravida  1   Para  1   Term      Preterm      AB      Living  1      SAB      IAB      Ectopic      Multiple      Live Births                 Patient Active Problem List   Diagnosis Date Noted   Recurrent cold sores 12/28/2020   Local reaction to COVID-19 vaccine 12/28/2020   GERD (gastroesophageal reflux disease) 08/19/2019   CHRONIC OBSTRUCTIVE PULMONARY DISEASE 09/21/2010   TOBACCO ABUSE 09/07/2010   Depression, recurrent ([place]) 09/07/2010   Dyspnea on exertion 09/07/2010   CHEST PAIN, ATYPICAL, HX OF 09/07/2010  Negative cardiac work up.   Past Medical History:  Diagnosis Date   Anxiety    Chest pain    COPD (chronic obstructive pulmonary disease) ([place])    COVID-19 05/2019    Past Surgical History:  Procedure Laterality Date   APPENDECTOMY     BREAST SURGERY     Biopsy neg.    Current Outpatient Medications  Medication Sig Dispense Refill   albuterol (VENTOLIN HFA) 108 (90 Base) MCG/ACT inhaler INHALE 1 TO 2 PUFFS INTO THE  LUNGS EVERY6 HOURS AS NEEDED FOR WHEEZING OR SHORTNESS OF BREATH 8.5 g 11   aspirin EC 81 MG tablet Take 81 mg by mouth daily.     buPROPion (WELLBUTRIN XL) 150 MG 24 hr tablet TAKE 1 TABLET BY MOUTH ONCE A DAY 30 tablet 3   cholecalciferol (VITAMIN D3) 25 MCG (1000 UT) tablet Take 1,000 Units by mouth daily.     diphenhydrAMINE (BENADRYL) 25 MG tablet Take 25 mg by mouth 2 (two) times daily as needed for allergies.     FLUoxetine (PROZAC) 20 MG capsule Take 1 capsule (20 mg total) by mouth daily. 30 capsule 11   Fluticasone-Umeclidin-Vilant (TRELEGY ELLIPTA) 100-62.5-25 MCG/INH AEPB Inhale 1 puff into the lungs daily. 60 each 11   ibuprofen (ADVIL,MOTRIN) 200 MG tablet Take 800 mg by mouth every 6 (six) hours as needed for headache (pain).     pantoprazole (PROTONIX) 40 MG tablet TAKE 1 TABLET BY MOUTH ONCE A DAY 90 tablet 0   valACYclovir (VALTREX) 1000 MG tablet TAKE 2 TABLETS BY MOUTH TWICE A DAY FOR ONE DAY FOR COLD  SORE 12 tablet 1   ALPRAZolam (XANAX) 0.25 MG tablet Take 1 tablet (0.25 mg total) by mouth daily as needed for anxiety. (Patient not taking: Reported on 09/08/2021) 30 tablet 0   No current facility-administered medications for this visit.     ALLERGIES: Patient has no known allergies.  Family History  Problem Relation Age of Onset   Osteoarthritis Mother    Heart failure Mother    Hypertension Mother    COPD Mother    Breast cancer Mother    Cancer Father        Throat, lung, brain   Heart attack Maternal Grandfather    Kidney failure Maternal Grandmother    Breast cancer Paternal Grandmother    Colon cancer Neg Hx    Esophageal cancer Neg Hx    Stomach cancer Neg Hx    Rectal cancer Neg Hx     Social History   Socioeconomic History   Marital status: Married    Spouse name: Not on file   Number of children: Not on file   Years of education: Not on file   Highest education level: Not on file  Occupational History   Not on file  Tobacco Use   Smoking  status: Every Day    Packs/day: 0.25    Years: 35.00    Pack years: 8.75    Types: Cigarettes   Smokeless tobacco: Never   Tobacco comments:    1 cig every 2 days  Vaping Use   Vaping Use: Former  Substance and Sexual Activity   Alcohol use: Yes    Alcohol/week: 0.0 standard drinks    Comment: Rare   Drug use: No   Sexual activity: Not Currently    Birth control/protection: None    Comment: 1st intercourse 3 yo-5 partners  Other Topics Concern   Not on file  Social History Narrative   Married   1 child - daughter   Lives with spouse   Customer Service   Social Determinants of Health   Financial Resource Strain: Not on file  Food Insecurity: Not on file  Transportation Needs: Not on file  Physical Activity: Not on file  Stress: Not on file  Social Connections: Not on file  Intimate Partner Violence: Not on file    ROS  PHYSICAL EXAMINATION:    BP 124/76   LMP 03/18/2019 (Approximate)     General appearance: alert, cooperative and appears stated age  89. Vasomotor symptoms due to menopause Discussed behavior changes, avoiding triggers -She is on a SSRI -Discussed over the counter options, gabapentin and HRT. No contraindications for HRT, risks reviewed -She is considering seeing a holistic provider, discussed risks of pellet therapy and topical progesterone -For now she will try Estroven and Estroven pm  2. Urinary urgency - Urinalysis,Complete w/RFL Culture  3. Overactive bladder Urge incontinence - Urinalysis,Complete w/RFL Culture - Ambulatory referral to Physical Therapy -Bladder training information given  ~27 minutes in total patient care

## 2021-09-08 NOTE — Patient Instructions (Signed)

## 2021-09-10 LAB — URINALYSIS, COMPLETE W/RFL CULTURE
Bilirubin Urine: NEGATIVE
Glucose, UA: NEGATIVE
Hgb urine dipstick: NEGATIVE
Hyaline Cast: NONE SEEN /LPF
Ketones, ur: NEGATIVE
Nitrites, Initial: NEGATIVE
Protein, ur: NEGATIVE
Specific Gravity, Urine: 1.02 (ref 1.001–1.035)
pH: 6.5 (ref 5.0–8.0)

## 2021-09-10 LAB — URINE CULTURE
MICRO NUMBER:: 12595737
SPECIMEN QUALITY:: ADEQUATE

## 2021-09-10 LAB — CULTURE INDICATED

## 2021-09-11 ENCOUNTER — Telehealth: Payer: Self-pay | Admitting: *Deleted

## 2021-09-11 NOTE — Telephone Encounter (Signed)
-----   Message from Salvadore Dom, MD sent at 09/08/2021  2:16 PM EDT ----- Referral to [facility] therapist placed. Thanks, Sharee Pimple

## 2021-09-11 NOTE — Telephone Encounter (Signed)
Office notes faxed to urology [phone], they will call to schedule. Patient informed they will call to schedule.

## 2021-09-19 NOTE — Telephone Encounter (Signed)
Patient scheduled on 09/22/21

## 2021-10-16 ENCOUNTER — Other Ambulatory Visit: Payer: Self-pay | Admitting: Gastroenterology

## 2021-10-16 ENCOUNTER — Other Ambulatory Visit: Payer: Self-pay | Admitting: Family Medicine

## 2021-10-16 DIAGNOSIS — F419 Anxiety disorder, unspecified: Secondary | ICD-10-CM

## 2021-10-17 ENCOUNTER — Other Ambulatory Visit: Payer: Commercial Managed Care - PPO

## 2021-11-14 ENCOUNTER — Other Ambulatory Visit: Payer: Self-pay | Admitting: Family Medicine

## 2021-11-14 DIAGNOSIS — F419 Anxiety disorder, unspecified: Secondary | ICD-10-CM

## 2021-11-22 ENCOUNTER — Other Ambulatory Visit: Payer: Self-pay | Admitting: Family

## 2021-11-27 ENCOUNTER — Other Ambulatory Visit: Payer: Self-pay | Admitting: Pulmonary Disease

## 2021-11-27 DIAGNOSIS — R918 Other nonspecific abnormal finding of lung field: Secondary | ICD-10-CM

## 2021-12-12 ENCOUNTER — Ambulatory Visit (INDEPENDENT_AMBULATORY_CARE_PROVIDER_SITE_OTHER)
Admission: RE | Admit: 2021-12-12 | Discharge: 2021-12-12 | Disposition: A | Discharge status: Home / Self Care | Payer: Commercial Managed Care - PPO | Source: Ambulatory Visit | Attending: Pulmonary Disease | Admitting: Pulmonary Disease

## 2021-12-12 ENCOUNTER — Other Ambulatory Visit: Payer: Self-pay

## 2021-12-12 DIAGNOSIS — R918 Other nonspecific abnormal finding of lung field: Secondary | ICD-10-CM | POA: Diagnosis not present

## 2021-12-13 ENCOUNTER — Other Ambulatory Visit: Payer: Self-pay | Admitting: Family Medicine

## 2021-12-13 DIAGNOSIS — F419 Anxiety disorder, unspecified: Secondary | ICD-10-CM

## 2021-12-13 NOTE — Telephone Encounter (Signed)
Dr. Glori Bickers say pt once for an acute appt almost a year ago but pt needs a TOC appt with any provider accepting new pt's

## 2021-12-14 NOTE — Telephone Encounter (Signed)
Lvm with pt to see if she would like to do a TOC .

## 2021-12-15 NOTE — Progress Notes (Signed)
Please let patient know I reviewed her CT scan.  Lung nodules are stable.  No additional follow-up needed.  However she is likely to qualify for lung cancer screening.  Please place referral for lung cancer screening to be completed next February 2024.  Thanks,  [facility]  Garner Nash, DO [facility] 12/15/2021 4:18 PM

## 2021-12-19 ENCOUNTER — Other Ambulatory Visit: Payer: Self-pay

## 2021-12-19 DIAGNOSIS — R918 Other nonspecific abnormal finding of lung field: Secondary | ICD-10-CM

## 2021-12-19 NOTE — Telephone Encounter (Signed)
LMTCB to see if pt wanted to schedule a TOC

## 2021-12-19 NOTE — Telephone Encounter (Signed)
Have not been able to reach patient. Dr Glori Bickers, ok to deny refill until patient calls back?

## 2021-12-19 NOTE — Telephone Encounter (Signed)
I refilled once to give her a chance to call back

## 2021-12-19 NOTE — Progress Notes (Signed)
G can

## 2022-01-18 ENCOUNTER — Other Ambulatory Visit: Payer: Self-pay | Admitting: Family Medicine

## 2022-01-26 ENCOUNTER — Other Ambulatory Visit: Payer: Self-pay | Admitting: Gastroenterology

## 2022-01-26 ENCOUNTER — Other Ambulatory Visit: Payer: Self-pay | Admitting: Family Medicine

## 2022-01-26 DIAGNOSIS — F419 Anxiety disorder, unspecified: Secondary | ICD-10-CM

## 2022-01-26 NOTE — Telephone Encounter (Signed)
We saw pt once for an acute appt a year ago but pt needs to get a TOC appt scheduled with available provider  ?

## 2022-02-01 ENCOUNTER — Other Ambulatory Visit: Payer: Self-pay | Admitting: Family Medicine

## 2022-02-01 DIAGNOSIS — F419 Anxiety disorder, unspecified: Secondary | ICD-10-CM

## 2022-02-01 NOTE — Telephone Encounter (Signed)
Pt saw Dr. Glori Bickers once over a year ago, no PCP will route to [facility] pool for review  ?

## 2022-02-02 ENCOUNTER — Telehealth: Payer: Self-pay | Admitting: Cardiovascular Disease

## 2022-02-02 NOTE — Telephone Encounter (Signed)
Pt has a TOC with T Dugal FNP scheduled on 03/02/2022 at 8 AM. Sending note to [place]. ?

## 2022-02-02 NOTE — Telephone Encounter (Signed)
Patient called in regards to money owed to the office, call was transferred to billing.  ?

## 2022-02-02 NOTE — Telephone Encounter (Signed)
?  Encourage patient to contact the pharmacy for refills or they can request refills through [facility] ? ?LAST APPOINTMENT DATE:  Please schedule appointment if longer than 1 year ? ?NEXT APPOINTMENT DATE: ? ?MEDICATION:FLUoxetine (PROZAC) 20 MG capsule ? ?Is the patient out of medication?  ? ?PHARMACY:FLUoxetine (PROZAC) 20 MG capsule ? ?Let patient know to contact pharmacy at the end of the day to make sure medication is ready. ? ?Please notify patient to allow 48-72 hours to process ? ?CLINICAL FILLS OUT ALL BELOW:  ? ?LAST REFILL: ? ?QTY: ? ?REFILL DATE: ? ? ? ?OTHER COMMENTS:  ? ? ?Okay for refill? ? ?Please advise ? ? ? ? ?

## 2022-02-09 NOTE — Telephone Encounter (Signed)
Patient has TOC appt with Eugenia Pancoast, NP on 03/02/22. ?

## 2022-02-22 ENCOUNTER — Other Ambulatory Visit: Payer: Self-pay | Admitting: Family Medicine

## 2022-02-22 DIAGNOSIS — F419 Anxiety disorder, unspecified: Secondary | ICD-10-CM

## 2022-02-22 NOTE — Telephone Encounter (Signed)
Pt establishes with you 4/28. Okay to refill under your name? ?

## 2022-02-26 ENCOUNTER — Encounter: Payer: Self-pay | Admitting: Family

## 2022-03-02 ENCOUNTER — Ambulatory Visit (INDEPENDENT_AMBULATORY_CARE_PROVIDER_SITE_OTHER): Payer: Commercial Managed Care - PPO | Admitting: Family

## 2022-03-02 ENCOUNTER — Encounter: Payer: Self-pay | Admitting: Family

## 2022-03-02 VITALS — BP 116/70 | HR 82 | Temp 97.9°F | Resp 16 | Ht 64.0 in | Wt 159.1 lb

## 2022-03-02 DIAGNOSIS — F419 Anxiety disorder, unspecified: Secondary | ICD-10-CM

## 2022-03-02 DIAGNOSIS — E782 Mixed hyperlipidemia: Secondary | ICD-10-CM

## 2022-03-02 DIAGNOSIS — R918 Other nonspecific abnormal finding of lung field: Secondary | ICD-10-CM

## 2022-03-02 DIAGNOSIS — K219 Gastro-esophageal reflux disease without esophagitis: Secondary | ICD-10-CM

## 2022-03-02 DIAGNOSIS — J449 Chronic obstructive pulmonary disease, unspecified: Secondary | ICD-10-CM | POA: Insufficient documentation

## 2022-03-02 DIAGNOSIS — N3281 Overactive bladder: Secondary | ICD-10-CM | POA: Insufficient documentation

## 2022-03-02 DIAGNOSIS — B001 Herpesviral vesicular dermatitis: Secondary | ICD-10-CM

## 2022-03-02 DIAGNOSIS — J439 Emphysema, unspecified: Secondary | ICD-10-CM

## 2022-03-02 DIAGNOSIS — N951 Menopausal and female climacteric states: Secondary | ICD-10-CM

## 2022-03-02 DIAGNOSIS — Z789 Other specified health status: Secondary | ICD-10-CM | POA: Insufficient documentation

## 2022-03-02 DIAGNOSIS — Z1231 Encounter for screening mammogram for malignant neoplasm of breast: Secondary | ICD-10-CM | POA: Insufficient documentation

## 2022-03-02 DIAGNOSIS — Z780 Asymptomatic menopausal state: Secondary | ICD-10-CM

## 2022-03-02 DIAGNOSIS — D3502 Benign neoplasm of left adrenal gland: Secondary | ICD-10-CM

## 2022-03-02 DIAGNOSIS — R739 Hyperglycemia, unspecified: Secondary | ICD-10-CM

## 2022-03-02 MED ORDER — ALPRAZOLAM 0.25 MG PO TABS: 0.2500 mg | ORAL_TABLET | Freq: Every day | ORAL | 0 refills | Status: AC | PRN

## 2022-03-02 MED ORDER — PANTOPRAZOLE SODIUM 40 MG PO TBEC: 40.0000 mg | DELAYED_RELEASE_TABLET | Freq: Every day | ORAL | 1 refills | Status: DC

## 2022-03-02 MED ORDER — TRELEGY ELLIPTA 100-62.5-25 MCG/ACT IN AEPB: 1.0000 | INHALATION_SPRAY | Freq: Every day | RESPIRATORY_TRACT | 2 refills | Status: DC

## 2022-03-02 MED ORDER — BUPROPION HCL ER (XL) 150 MG PO TB24: ORAL_TABLET | ORAL | 1 refills | Status: DC

## 2022-03-02 MED ORDER — FLUOXETINE HCL 20 MG PO CAPS: 20.0000 mg | ORAL_CAPSULE | Freq: Every day | ORAL | 1 refills | Status: DC

## 2022-03-02 NOTE — Patient Instructions (Addendum)
Try to decrease and or avoid spicy foods, fried fatty foods, and also caffeine and chocolate as these can increase heartburn symptoms.  ? ?[facility] to schedule your mammogram as well as your bone density scan as I have sent the electronic order to their facility.  ? ?Welcome to our clinic, I am happy to have you as my new patient. I am excited to continue on this healthcare journey with you. ? ?Stop by the lab prior to leaving today. I will notify you of your results once received.  ? ?Please keep in mind ?Any my chart messages you send have p to a three business day turnaround for a response.  ?Phone calls may have up to a one day business turnaround for a  response.  ? ?If you need a medication refill I recommend you request it through the pharmacy as this is easiest for [place] rather than sending a message and or phone call.  ? ?Due to recent changes in healthcare laws, you may see results of your imaging and/or laboratory studies on MyChart before I have had a chance to review them.  I understand that in some cases there may be results that are confusing or concerning to you. Please understand that not all results are received at the same time and often I may need to interpret multiple results in order to provide you with the best plan of care or course of treatment. Therefore, I ask that you please give me 2 business days to thoroughly review all your results before contacting my office for clarification. Should we see a critical lab result, you will be contacted sooner.  ? ?It was a pleasure seeing you today! Please do not hesitate to reach out with any questions and or concerns. ? ?Regards,  ? ?Sukaina Toothaker ?FNP-C ? ? ?

## 2022-03-02 NOTE — Assessment & Plan Note (Signed)
Work on diabetic diet exercise as tolerated ?Ordering a1c pending results ?

## 2022-03-02 NOTE — Assessment & Plan Note (Signed)
Reviewed most recent CT scan, stable.  ?Annual screening recommended.  ?Cont f/u with pulmonary  ?

## 2022-03-02 NOTE — Assessment & Plan Note (Signed)
Refill sent for trelegy  ?

## 2022-03-02 NOTE — Assessment & Plan Note (Signed)
D/w pt trying to wean down from protonix as increased risk for osteoporosis ?Reviewed last EGD, normal findings.  ?Motility of esophagus, pt to work on diet as well.  ?GERD diet recommendations printed for pt and sent to mychart for review  ?

## 2022-03-02 NOTE — Assessment & Plan Note (Addendum)
Reviewed from prior CT scan ?Continue f/u with pulmonary as scheduled  ?Refilled trelegy ellipta 100-62.5-25 mg ?

## 2022-03-02 NOTE — Assessment & Plan Note (Signed)
Work on low cholesterol diet, exercise as tolerated. ?Take medication as prescribed. ?Lipid panel ordered pending results ?

## 2022-03-02 NOTE — Assessment & Plan Note (Signed)
Refill valtrex 1000 mg prn  ?

## 2022-03-02 NOTE — Assessment & Plan Note (Signed)
Continue annual CT lung screening ?

## 2022-03-02 NOTE — Assessment & Plan Note (Signed)
Ordering bone density scan pending results ?

## 2022-03-02 NOTE — Assessment & Plan Note (Signed)
Stable ?Continue with gyn prn ?

## 2022-03-02 NOTE — Assessment & Plan Note (Addendum)
Refill bupropion 150 mg  ?Refill prozac 20 mg  ?Work on Anxiety reducing techniques ?pdmp reviewed, no suspicious activity ?Refill xanax sent to pharmacy.  ?

## 2022-03-02 NOTE — Progress Notes (Signed)
? ?Established Patient Office Visit ? ?Subjective:  ?Patient ID: Sophia Vance, female    DOB: Oct 31, 1966  Age: 56 y.o. MRN: [id number] ? ?CC:  ?Chief Complaint  ?Patient presents with  ? Transitions Of Care  ? ? ?HPI ?Sophia Vance is here for a transition of care visit. ? ?Prior provider [facility] Carlean Purl, [place]  ?Pt is without acute concerns.  ? ?chronic concerns: ? ?Gerd: protonix 40 mg once daily. Does experience food regurgitation, did go to GI. Went to endoscopy. Needs to take protonix to keep this from happening.  ? ?Allergies: takes benadryl as needed.  ? ?Constipation: takes daily stool softener, works well. Most of the time with daily bowel movements ? ?COPD: sees pulmonologist has f/u in one month. Taking trelegy ellipta, but has not used in over one week. Doesn't really use her albuterol, doesn't need to . No nighttime awakenings with sob. Sees Dr. June Leap.  ? ?CT chest 12/12/21, multiple pulmonary nodules, adrenal adenoma, stable on left side. Aortic arthersclerosis.  ?Past Medical History:  ?Diagnosis Date  ? Anxiety   ? Chest pain   ? COPD (chronic obstructive pulmonary disease) ([place])   ? COVID-19 05/2019  ? ? ?Past Surgical History:  ?Procedure Laterality Date  ? APPENDECTOMY    ? BREAST SURGERY    ? Biopsy neg.  ? ? ?Family History  ?Problem Relation Age of Onset  ? Osteoarthritis Mother   ? Heart failure Mother   ? Hypertension Mother   ? COPD Mother   ? Breast cancer Mother   ? Cancer Father   ?     Throat, lung, brain  ? Heart attack Maternal Grandfather   ? Kidney failure Maternal Grandmother   ? Breast cancer Paternal Grandmother   ? Colon cancer Neg Hx   ? Esophageal cancer Neg Hx   ? Stomach cancer Neg Hx   ? Rectal cancer Neg Hx   ? ? ?Social History  ? ?Socioeconomic History  ? Marital status: Married  ?  Spouse name: Not on file  ? Number of children: 1  ? Years of education: Not on file  ? Highest education level: Not on file  ?Occupational History  ?  Employer: [employer]  ?Tobacco Use  ? Smoking status: Former  ?  Packs/day: 0.25  ?  Years: 35.00  ?  Pack years: 8.75  ?  Types: Cigarettes  ? Smokeless tobacco: Never  ? Tobacco comments:  ?  1 cig every 2 days  ?Vaping Use  ? Vaping Use: Former  ?Substance and Sexual Activity  ? Alcohol use: Yes  ?  Alcohol/week: 0.0 standard drinks  ?  Comment: Rare  ? Drug use: No  ? Sexual activity: Not Currently  ?  Birth control/protection: None  ?  Comment: 1st intercourse 57 yo-5 partners  ?Other Topics Concern  ? Not on file  ?Social History Narrative  ? Married  ? 1 child - daughter  ? 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   ? Daughter age 3 , 2023  ? ?Social Determinants of Health  ? ?Financial Resource Strain: Not on file  ?Food Insecurity: Not on file  ?Transportation Needs: Not on file  ?Physical Activity: Not on file  ?Stress: Not on file  ?Social Connections: Not on file  ?Intimate Partner Violence: Not on file  ? ? ?Outpatient Medications Prior to Visit  ?Medication Sig Dispense Refill  ? albuterol (VENTOLIN HFA) 108 (90 Base) MCG/ACT  inhaler INHALE 1 TO 2 PUFFS INTO THE LUNGS EVERY6 HOURS AS NEEDED FOR WHEEZING OR SHORTNESS OF BREATH 8.5 g 11  ? diphenhydrAMINE (BENADRYL) 25 MG tablet Take 25 mg by mouth 2 (two) times daily as needed for allergies.    ? docusate sodium (STOOL SOFTENER) 100 MG capsule Take 100 mg by mouth daily.    ? ibuprofen (ADVIL,MOTRIN) 200 MG tablet Take 800 mg by mouth every 6 (six) hours as needed for headache (pain).    ? Magnesium Oxide (CVS MAGNESIUM OXIDE) 500 MG TABS Take by mouth.    ? Specialty Vitamins Products (CVS MENOPAUSE SUPPORT PO) Take by mouth.    ? valACYclovir (VALTREX) 1000 MG tablet TAKE 2 TABLETS BY MOUTH TWICE A DAY FOR ONE DAY FOR COLD SORE 12 tablet 1  ? buPROPion (WELLBUTRIN XL) 150 MG 24 hr tablet TAKE 1 TABLET BY MOUTH ONCE A DAY (NEED APPT FOR REFILLS) 30 tablet 0  ? FLUoxetine (PROZAC) 20 MG capsule TAKE 1 CAPSULE BY MOUTH ONCE DAILY 30 capsule 0  ?  Fluticasone-Umeclidin-Vilant (TRELEGY ELLIPTA) 100-62.5-25 MCG/INH AEPB Inhale 1 puff into the lungs daily. 60 each 11  ? pantoprazole (PROTONIX) 40 MG tablet Take 1 tablet (40 mg total) by mouth daily. PLEASE SCHEDULE OFFICE VISIT FOR ADDITIONAL REFILLS 90 tablet 0  ? ALPRAZolam (XANAX) 0.25 MG tablet Take 1 tablet (0.25 mg total) by mouth daily as needed for anxiety. (Patient not taking: Reported on 09/08/2021) 30 tablet 0  ? aspirin EC 81 MG tablet Take 81 mg by mouth daily. (Patient not taking: Reported on 03/02/2022)    ? cholecalciferol (VITAMIN D3) 25 MCG (1000 UT) tablet Take 1,000 Units by mouth daily. (Patient not taking: Reported on 03/02/2022)    ? ?No facility-administered medications prior to visit.  ? ? ?No Known Allergies ? ?ROS ?Review of Systems ? ?Review of Systems  ?Respiratory:  Negative for shortness of breath.   ?Cardiovascular:  Negative for chest pain and palpitations.  ?Gastrointestinal:  Negative for constipation and diarrhea.  ?Genitourinary:  Negative for dysuria, frequency and urgency.  ?Musculoskeletal:  Negative for myalgias.  ?Psychiatric/Behavioral:  Negative for depression and suicidal ideas.   ?All other systems reviewed and are negative. ? ?  ?Objective:  ?  ?Physical Exam ? ?Gen: NAD, resting comfortably ?CV: RRR with no murmurs appreciated ?Pulm: NWOB, CTAB with no crackles, wheezes, or rhonchi ?Skin: warm, dry ?Psych: Normal affect and thought content ? ?BP 116/70   Pulse 82   Temp 97.9 ?F (36.6 ?C)   Resp 16   Ht '5\' 4"'$  (1.626 m)   Wt 159 lb 2 oz (72.2 kg)   LMP 03/18/2019 (Approximate)   SpO2 98%   BMI 27.31 kg/m?  ?Wt Readings from Last 3 Encounters:  ?03/02/22 159 lb 2 oz (72.2 kg)  ?03/21/21 156 lb 3.2 oz (70.9 kg)  ?12/28/20 156 lb (70.8 kg)  ? ? ? ?Health Maintenance Due  ?Topic Date Due  ? HIV Screening  Never done  ? Hepatitis C Screening  Never done  ? Zoster Vaccines- Shingrix (1 of 2) Never done  ? TETANUS/TDAP  09/07/2020  ? COVID-19 Vaccine (3 - Booster for  Pfizer series) 01/09/2021  ? MAMMOGRAM  11/17/2021  ? ? ?There are no preventive care reminders to display for this patient. ? ?Lab Results  ?Component Value Date  ? TSH 1.00 05/14/2019  ? ?Lab Results  ?Component Value Date  ? WBC 12.3 (H) 08/05/2019  ? HGB 14.3 08/05/2019  ?  HCT 42.6 08/05/2019  ? MCV 92.2 08/05/2019  ? PLT 324 08/05/2019  ? ?Lab Results  ?Component Value Date  ? NA 144 08/05/2019  ? K 4.1 08/05/2019  ? CO2 26 08/05/2019  ? GLUCOSE 103 (H) 08/05/2019  ? BUN 5 (L) 08/05/2019  ? CREATININE 0.88 08/05/2019  ? BILITOT 0.4 05/16/2019  ? ALKPHOS 86 05/16/2019  ? AST 45 (H) 05/16/2019  ? ALT 46 (H) 05/16/2019  ? PROT 6.5 05/16/2019  ? ALBUMIN 3.8 05/16/2019  ? CALCIUM 9.4 08/05/2019  ? ANIONGAP 11 08/05/2019  ? ?Lab Results  ?Component Value Date  ? CHOL 214 (H) 05/14/2019  ? ?Lab Results  ?Component Value Date  ? HDL 53 05/14/2019  ? ?Lab Results  ?Component Value Date  ? LDLCALC 131 (H) 05/14/2019  ? ?Lab Results  ?Component Value Date  ? TRIG 167 (H) 05/14/2019  ? ?Lab Results  ?Component Value Date  ? CHOLHDL 4.0 05/14/2019  ? ?No results found for: HGBA1C ? ?  ?Assessment & Plan:  ? ?Problem List Items Addressed This Visit   ? ?  ? Respiratory  ? Pulmonary emphysema ([place])  ?  Reviewed from prior CT scan ?Continue f/u with pulmonary as scheduled  ?Refilled trelegy ellipta 100-62.5-25 mg ? ?  ?  ? Relevant Medications  ? Fluticasone-Umeclidin-Vilant (TRELEGY ELLIPTA) 100-62.5-25 MCG/ACT AEPB  ? Other Relevant Orders  ? CBC with Differential/Platelet  ? Multiple pulmonary nodules  ?  Reviewed most recent CT scan, stable.  ?Annual screening recommended.  ?Cont f/u with pulmonary  ? ?  ?  ?  ? Digestive  ? GERD (gastroesophageal reflux disease)  ?  D/w pt trying to wean down from protonix as increased risk for osteoporosis ?Reviewed last EGD, normal findings.  ?Motility of esophagus, pt to work on diet as well.  ?GERD diet recommendations printed for pt and sent to mychart for review  ? ?  ?  ? Relevant  Medications  ? docusate sodium (STOOL SOFTENER) 100 MG capsule  ? pantoprazole (PROTONIX) 40 MG tablet  ? Recurrent cold sores  ?  Refill valtrex 1000 mg prn  ? ?  ?  ?  ? Endocrine  ? Adrenal adenoma, left  ?  Reviewed most recent CT

## 2022-03-02 NOTE — Assessment & Plan Note (Signed)
Mammogram ordered. Pending results. 

## 2022-03-02 NOTE — Assessment & Plan Note (Signed)
Reviewed most recent CT scan , adeonoma stable. Reviewed with pt. ?

## 2022-03-14 ENCOUNTER — Ambulatory Visit (INDEPENDENT_AMBULATORY_CARE_PROVIDER_SITE_OTHER): Payer: Commercial Managed Care - PPO | Admitting: Pulmonary Disease

## 2022-03-14 ENCOUNTER — Encounter: Payer: Self-pay | Admitting: Pulmonary Disease

## 2022-03-14 VITALS — BP 132/90 | HR 70 | Temp 98.2°F | Ht 65.0 in | Wt 158.4 lb

## 2022-03-14 DIAGNOSIS — J449 Chronic obstructive pulmonary disease, unspecified: Secondary | ICD-10-CM | POA: Diagnosis not present

## 2022-03-14 DIAGNOSIS — Z8616 Personal history of COVID-19: Secondary | ICD-10-CM | POA: Diagnosis not present

## 2022-03-14 DIAGNOSIS — R918 Other nonspecific abnormal finding of lung field: Secondary | ICD-10-CM

## 2022-03-14 DIAGNOSIS — J302 Other seasonal allergic rhinitis: Secondary | ICD-10-CM

## 2022-03-14 MED ORDER — FEXOFENADINE HCL 180 MG PO TABS: 180.0000 mg | ORAL_TABLET | Freq: Every day | ORAL | 3 refills | Status: DC

## 2022-03-14 MED ORDER — MOMETASONE FUROATE 50 MCG/ACT NA SUSP: 2.0000 | Freq: Every day | NASAL | 11 refills | Status: DC

## 2022-03-14 MED ORDER — MONTELUKAST SODIUM 10 MG PO TABS: 10.0000 mg | ORAL_TABLET | Freq: Every day | ORAL | 11 refills | Status: DC

## 2022-03-14 NOTE — Progress Notes (Signed)
? ?Synopsis: Referred in August 2020 for COPD by Elby Beck, FNP ? ?Subjective:  ? ?PATIENT ID: Sophia Vance GENDER: female DOB: Dec 12, 1965, MRN: [id number] ? ?Chief Complaint  ?Patient presents with  ? Follow-up  ?  Follow up. Patient says she has a bad cough.   ? ? ?Diagnosed with COVID 19, fever, cough, sweating, loss of taste and smell, on July 11th. Current smoker, smokes 1 ppd today, started smoking at age 56 yo.  Overall she has tried to quit smoking the past and has been unsuccessful.  She was given a prescription of Chantix in the past but never took the medication.  She understands that she needs to quit smoking but has been very difficult for her.  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  She has been able to work from home due to the pandemic as well as her diagnosis of COVID.  Her sister works for the court house and there was positive cases there.  Unfortunately her sister was diagnosed with COVID and asymptomatic which ended up spreading it to her niece as well as her and her husband.  Fortunately none of the family was hospitalized due to symptomatology.  She has been seen in our office before.  Back in 2017 she was evaluated with pulmonary function test and diagnosed with mild COPD.  She had an FEV1 of 91% predicted with a ratio less than 70. ? ?OV 10/13/2019: Here today for follow-up regarding COPD.  Approximately 6 weeks ago had virtual visit with [employer]  Inhaler regimen switched to Symbicort twice daily.  Here to follow-up regarding her symptoms.  She also had a 6-minute walk test completed today.  She did well with this walking 550 m.  She does state that she is trying to walk weekly at the local mall inside.  She does feel dyspneic on exertion.  She was recently switched to Symbicort.  She states that she feels a little bit better on this.  She had PFTs completed yesterday which revealed a FEV1 FVC ratio of 68 and a FEV1 of 87% predicted at 2.56 L.  She  does have a significant bronchodilator response at 19%.  Normal DLCO as well as significant air trapping and hyperinflation noted with an RV of 160%.  She has having significant trouble quitting smoking.  Today we talked about this in the office.  She is currently smoking at least 1 cigarette an hour.  Upwards of 15 a day. ? ?OV 09/07/2020: She is here today for an acute visit. She called yesterday for increasing sob, cough and sputum production. She was started on azithro and prednisone.  Patient has used her inhaler couple of times over the past few days.  She states that she has been compliant with Trelegy.  She is worried that there is just something going on with her lungs.  She is not been feeling any better.  She also has ongoing shortness of breath and dyspnea.  She is concerned that some of her symptoms may be lingering from her prior Covid diagnosis.  She had pulmonary function test completed in the past which showed reduced ratio and a mildly reduced FEV1 consistent with stage I COPD.  Of note she had a Covid test last week which was negative. ? ?OV 10/20/2020: Patient here today for follow-up after recent CT imaging.  Patient had CT scan of the chest after having history of COVID-19.  She does not have any evidence on  her CT scan of interstitial lung disease or fibrosis or scarring related to Covid.  She does have multiple small pulmonary nodules 4 mm or less in size considered low risk.  She is a longstanding smoker with evidence of paraseptal emphysema.  She does still complain of vague epigastric pains and chest pains.  A lot of this seems to be anxiety related when discussing with the patient.  She is concerned about her gallbladder.  Whether or not this could be the culprit. ? ?OV 03/14/2022: Patient here today for follow-up regarding COPD.  She is doing really well today.  She has been using her Trelegy.  She does state that she has a little bit of a cough.  The cough has been lingering for some time  associated with some drainage and may be seasonal allergy related.  She does take an as needed antihistamine every once in a while.  But from a respiratory standpoint she is able to complete most of her activities of daily living.  Feeling better.  Thankfully she has quit smoking approximately 6 months ago.  She is really proud of this little bit she to tell me today but she is very happy that she has quit and feels much better. ? ? ? ? ?Past Medical History:  ?Diagnosis Date  ? Anxiety   ? Chest pain   ? COPD (chronic obstructive pulmonary disease) ([place])   ? COVID-19 05/2019  ?  ? ?Family History  ?Problem Relation Age of Onset  ? Osteoarthritis Mother   ? Heart failure Mother   ? Hypertension Mother   ? COPD Mother   ? Breast cancer Mother   ? Cancer Father   ?     Throat, lung, brain  ? Heart attack Maternal Grandfather   ? Kidney failure Maternal Grandmother   ? Breast cancer Paternal Grandmother   ? Colon cancer Neg Hx   ? Esophageal cancer Neg Hx   ? Stomach cancer Neg Hx   ? Rectal cancer Neg Hx   ?  ? ?Past Surgical History:  ?Procedure Laterality Date  ? APPENDECTOMY    ? BREAST SURGERY    ? Biopsy neg.  ? ? ?Social History  ? ?Socioeconomic History  ? Marital status: Married  ?  Spouse name: Not on file  ? Number of children: 1  ? Years of education: Not on file  ? Highest education level: Not on file  ?Occupational History  ?  Employer: [employer]  ?Tobacco Use  ? Smoking status: Former  ?  Packs/day: 0.25  ?  Years: 35.00  ?  Pack years: 8.75  ?  Types: Cigarettes  ? Smokeless tobacco: Never  ? Tobacco comments:  ?  1 cig every 2 days  ?Vaping Use  ? Vaping Use: Former  ?Substance and Sexual Activity  ? Alcohol use: Yes  ?  Alcohol/week: 0.0 standard drinks  ?  Comment: Rare  ? Drug use: No  ? Sexual activity: Not Currently  ?  Birth control/protection: None  ?  Comment: 1st intercourse 62 yo-5 partners  ?Other Topics Concern  ? Not on file  ?Social History Narrative  ? Married  ? 1 child -  daughter  ? 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   ? Daughter age 13 , 2023  ? ?Social Determinants of Health  ? ?Financial Resource Strain: Not on file  ?Food Insecurity: Not on file  ?Transportation Needs: Not on file  ?Physical Activity:  Not on file  ?Stress: Not on file  ?Social Connections: Not on file  ?Intimate Partner Violence: Not on file  ?  ? ?No Known Allergies  ? ?Outpatient Medications Prior to Visit  ?Medication Sig Dispense Refill  ? albuterol (VENTOLIN HFA) 108 (90 Base) MCG/ACT inhaler INHALE 1 TO 2 PUFFS INTO THE LUNGS EVERY6 HOURS AS NEEDED FOR WHEEZING OR SHORTNESS OF BREATH 8.5 g 11  ? ALPRAZolam (XANAX) 0.25 MG tablet Take 1 tablet (0.25 mg total) by mouth daily as needed for anxiety. 30 tablet 0  ? buPROPion (WELLBUTRIN XL) 150 MG 24 hr tablet TAKE 1 TABLET BY MOUTH ONCE A DAY (NEED APPT FOR REFILLS) 90 tablet 1  ? diphenhydrAMINE (BENADRYL) 25 MG tablet Take 25 mg by mouth 2 (two) times daily as needed for allergies.    ? docusate sodium (COLACE) 100 MG capsule Take 100 mg by mouth daily.    ? FLUoxetine (PROZAC) 20 MG capsule Take 1 capsule (20 mg total) by mouth daily. 90 capsule 1  ? Fluticasone-Umeclidin-Vilant (TRELEGY ELLIPTA) 100-62.5-25 MCG/ACT AEPB Inhale 1 puff into the lungs daily. 1 each 2  ? ibuprofen (ADVIL,MOTRIN) 200 MG tablet Take 800 mg by mouth every 6 (six) hours as needed for headache (pain).    ? Magnesium Oxide 500 MG TABS Take by mouth.    ? pantoprazole (PROTONIX) 40 MG tablet Take 1 tablet (40 mg total) by mouth daily. PLEASE SCHEDULE OFFICE VISIT FOR ADDITIONAL REFILLS 90 tablet 1  ? Specialty Vitamins Products (CVS MENOPAUSE SUPPORT PO) Take by mouth.    ? valACYclovir (VALTREX) 1000 MG tablet TAKE 2 TABLETS BY MOUTH TWICE A DAY FOR ONE DAY FOR COLD SORE 12 tablet 1  ? ?No facility-administered medications prior to visit.  ? ? ?Review of Systems  ?Constitutional:  Negative for chills, fever, malaise/fatigue and weight loss.  ?HENT:  Negative for hearing loss,  sore throat and tinnitus.   ?Eyes:  Negative for blurred vision and double vision.  ?Respiratory:  Positive for cough. Negative for hemoptysis, sputum production, shortness of breath, wheezing and stridor

## 2022-03-14 NOTE — Patient Instructions (Signed)
Thank you for visiting Dr. Valeta Harms at [facility]. ?Today we recommend the following: ? ?Orders Placed This Encounter  ?Procedures  ? Ambulatory Referral for Lung Cancer Scre  ? ?Meds ordered this encounter  ?Medications  ? mometasone (NASONEX) 50 MCG/ACT nasal spray  ?  Sig: Place 2 sprays into the nose daily.  ?  Dispense:  1 each  ?  Refill:  11  ? montelukast (SINGULAIR) 10 MG tablet  ?  Sig: Take 1 tablet (10 mg total) by mouth at bedtime.  ?  Dispense:  30 tablet  ?  Refill:  11  ? fexofenadine (ALLEGRA) 180 MG tablet  ?  Sig: Take 1 tablet (180 mg total) by mouth daily.  ?  Dispense:  90 tablet  ?  Refill:  3  ? ?Return in about 1 year (around 03/15/2023), or if symptoms worsen or fail to improve. ? ? ? ?Please do your part to reduce the spread of COVID-19.  ? ?

## 2022-03-20 ENCOUNTER — Inpatient Hospital Stay: Admission: RE | Admit: 2022-03-20 | Payer: Commercial Managed Care - PPO | Source: Ambulatory Visit

## 2022-03-26 ENCOUNTER — Ambulatory Visit
Admission: RE | Admit: 2022-03-26 | Discharge: 2022-03-26 | Disposition: A | Discharge status: Home / Self Care | Payer: Commercial Managed Care - PPO | Source: Ambulatory Visit | Attending: Family | Admitting: Family

## 2022-03-26 DIAGNOSIS — Z1231 Encounter for screening mammogram for malignant neoplasm of breast: Secondary | ICD-10-CM

## 2022-03-29 ENCOUNTER — Ambulatory Visit
Admission: RE | Admit: 2022-03-29 | Discharge: 2022-03-29 | Disposition: A | Discharge status: Home / Self Care | Payer: Commercial Managed Care - PPO | Source: Ambulatory Visit | Attending: Family | Admitting: Family

## 2022-03-29 DIAGNOSIS — Z789 Other specified health status: Secondary | ICD-10-CM

## 2022-03-29 DIAGNOSIS — Z780 Asymptomatic menopausal state: Secondary | ICD-10-CM

## 2022-04-21 ENCOUNTER — Other Ambulatory Visit: Payer: Self-pay | Admitting: Family

## 2022-04-26 NOTE — Telephone Encounter (Signed)
This was taking care of on 03/02/22

## 2022-06-13 ENCOUNTER — Telehealth: Payer: Self-pay | Admitting: Family

## 2022-06-13 DIAGNOSIS — B009 Herpesviral infection, unspecified: Secondary | ICD-10-CM

## 2022-06-13 NOTE — Telephone Encounter (Signed)
Pt is wanting a refill on her valACYclovir (VALTREX) 1000 MG tablet [[id number]].   [address], [place]  [place], [place] [address]  Phone:  [phone]  Fax:  [phone]  She has 2 pills left and is going to the beach tomorrow 06/14/22 after lunch. She says she can feel it coming up on her lip.   Pt '@[phone]'$ [phone]

## 2022-06-14 MED ORDER — VALACYCLOVIR HCL 1 G PO TABS: ORAL_TABLET | ORAL | 1 refills | Status: DC

## 2022-09-26 ENCOUNTER — Other Ambulatory Visit: Payer: Self-pay | Admitting: Family

## 2022-09-26 DIAGNOSIS — J439 Emphysema, unspecified: Secondary | ICD-10-CM

## 2022-10-25 ENCOUNTER — Other Ambulatory Visit: Payer: Self-pay | Admitting: Family

## 2022-10-25 DIAGNOSIS — K219 Gastro-esophageal reflux disease without esophagitis: Secondary | ICD-10-CM

## 2022-10-25 DIAGNOSIS — F419 Anxiety disorder, unspecified: Secondary | ICD-10-CM

## 2022-11-09 ENCOUNTER — Telehealth: Payer: Self-pay | Admitting: Family

## 2022-11-09 NOTE — Telephone Encounter (Signed)
LOV 03/02/22 Return in about 6 months (around 09/01/2022) for regular follow up appt, come fasting to appt.   Patient has not been seen for follow up and does not have an appt scheduled.   Per pharmacy patient has refilled Trelegy as follows: 02/2022 04/2022 06/2022 Nothing since then.  Per information patient is not using medication as directed.  Please advise, thank you!

## 2022-11-09 NOTE — Telephone Encounter (Signed)
Patient notified via My Chart message.

## 2022-11-09 NOTE — Telephone Encounter (Signed)
Prescription Request  11/09/2022  Is this a "Controlled Substance" medicine? Yes  LOV: 03/02/22  What is the name of the medication or equipment? Fluticasone-Umeclidin-Vilant (TRELEGY ELLIPTA) 100-62.5-25 MCG/ACT AEPB   Have you contacted your pharmacy to request a refill? Yes   Which pharmacy would you like this sent to?  [place], [address] [place] [address] Phone: [phone] Fax: [phone]    Patient notified that their request is being sent to the clinical staff for review and that they should receive a response within 2 business days.   Please advise at Mobile [phone] (mobile)

## 2022-11-09 NOTE — Telephone Encounter (Signed)
Thank you for the information!  Pt needs appointment to have refill.

## 2022-11-29 ENCOUNTER — Other Ambulatory Visit: Payer: Self-pay | Admitting: Family

## 2022-11-29 DIAGNOSIS — B009 Herpesviral infection, unspecified: Secondary | ICD-10-CM

## 2022-11-29 DIAGNOSIS — F419 Anxiety disorder, unspecified: Secondary | ICD-10-CM

## 2022-11-29 NOTE — Telephone Encounter (Signed)
Can we please call pt? She repeatedly keeps sending requests for refills and she was due for f/u 10/23

## 2022-12-03 NOTE — Telephone Encounter (Signed)
Unable to reach patient, left voicemail to call back and schedule appointment in order to get refills on medications requested.

## 2022-12-05 NOTE — Telephone Encounter (Signed)
Spoke with the patient and she has an appt scheduled for 12/06/22 with Tabitha for a f/u.

## 2022-12-06 ENCOUNTER — Ambulatory Visit: Payer: Commercial Managed Care - PPO | Admitting: Family

## 2022-12-06 ENCOUNTER — Encounter: Payer: Self-pay | Admitting: *Deleted

## 2023-02-05 ENCOUNTER — Other Ambulatory Visit: Payer: Self-pay | Admitting: *Deleted

## 2023-02-05 DIAGNOSIS — Z122 Encounter for screening for malignant neoplasm of respiratory organs: Secondary | ICD-10-CM

## 2023-02-05 DIAGNOSIS — Z87891 Personal history of nicotine dependence: Secondary | ICD-10-CM

## 2023-02-11 ENCOUNTER — Other Ambulatory Visit: Payer: Self-pay | Admitting: Family

## 2023-02-11 DIAGNOSIS — F419 Anxiety disorder, unspecified: Secondary | ICD-10-CM

## 2023-02-11 MED ORDER — FLUOXETINE HCL 20 MG PO CAPS: 20.0000 mg | ORAL_CAPSULE | Freq: Every day | ORAL | 0 refills | Status: DC

## 2023-02-18 ENCOUNTER — Ambulatory Visit (INDEPENDENT_AMBULATORY_CARE_PROVIDER_SITE_OTHER): Payer: Commercial Managed Care - PPO | Admitting: Family

## 2023-02-18 ENCOUNTER — Encounter: Payer: Self-pay | Admitting: Family

## 2023-02-18 VITALS — BP 124/72 | HR 77 | Temp 98.3°F | Ht 65.0 in | Wt 159.4 lb

## 2023-02-18 DIAGNOSIS — Z1231 Encounter for screening mammogram for malignant neoplasm of breast: Secondary | ICD-10-CM

## 2023-02-18 DIAGNOSIS — R4189 Other symptoms and signs involving cognitive functions and awareness: Secondary | ICD-10-CM | POA: Diagnosis not present

## 2023-02-18 DIAGNOSIS — E782 Mixed hyperlipidemia: Secondary | ICD-10-CM | POA: Diagnosis not present

## 2023-02-18 DIAGNOSIS — Z87891 Personal history of nicotine dependence: Secondary | ICD-10-CM

## 2023-02-18 DIAGNOSIS — F419 Anxiety disorder, unspecified: Secondary | ICD-10-CM | POA: Diagnosis not present

## 2023-02-18 DIAGNOSIS — J439 Emphysema, unspecified: Secondary | ICD-10-CM

## 2023-02-18 DIAGNOSIS — E559 Vitamin D deficiency, unspecified: Secondary | ICD-10-CM

## 2023-02-18 DIAGNOSIS — Z789 Other specified health status: Secondary | ICD-10-CM

## 2023-02-18 DIAGNOSIS — R413 Other amnesia: Secondary | ICD-10-CM

## 2023-02-18 DIAGNOSIS — Z0001 Encounter for general adult medical examination with abnormal findings: Secondary | ICD-10-CM | POA: Insufficient documentation

## 2023-02-18 LAB — CBC
HCT: 40.2 % (ref 36.0–46.0)
Hemoglobin: 13.8 g/dL (ref 12.0–15.0)
MCHC: 34.2 g/dL (ref 30.0–36.0)
MCV: 91.6 fl (ref 78.0–100.0)
Platelets: 269 10*3/uL (ref 150.0–400.0)
RBC: 4.39 Mil/uL (ref 3.87–5.11)
RDW: 13.2 % (ref 11.5–15.5)
WBC: 7.8 10*3/uL (ref 4.0–10.5)

## 2023-02-18 LAB — LIPID PANEL
Cholesterol: 201 mg/dL — ABNORMAL HIGH (ref 0–200)
HDL: 63.4 mg/dL (ref >39.00)
LDL Cholesterol: 122 mg/dL — ABNORMAL HIGH (ref 0–99)
NonHDL: 137.75
Total CHOL/HDL Ratio: 3
Triglycerides: 78 mg/dL (ref 0.0–149.0)
VLDL: 15.6 mg/dL (ref 0.0–40.0)

## 2023-02-18 LAB — VITAMIN B12: Vitamin B-12: 163 pg/mL — ABNORMAL LOW (ref 211–911)

## 2023-02-18 LAB — BASIC METABOLIC PANEL
BUN: 11 mg/dL (ref 6–23)
CO2: 26 mEq/L (ref 19–32)
Calcium: 9.2 mg/dL (ref 8.4–10.5)
Chloride: 106 mEq/L (ref 96–112)
Creatinine, Ser: 0.83 mg/dL (ref 0.40–1.20)
GFR: 79.56 mL/min (ref >60.00)
Glucose, Bld: 85 mg/dL (ref 70–99)
Potassium: 4.2 mEq/L (ref 3.5–5.1)
Sodium: 142 mEq/L (ref 135–145)

## 2023-02-18 LAB — VITAMIN D 25 HYDROXY (VIT D DEFICIENCY, FRACTURES): VITD: 36.42 ng/mL (ref 30.00–100.00)

## 2023-02-18 MED ORDER — ALBUTEROL SULFATE HFA 108 (90 BASE) MCG/ACT IN AERS: 2.0000 | INHALATION_SPRAY | Freq: Four times a day (QID) | RESPIRATORY_TRACT | 0 refills | Status: AC | PRN

## 2023-02-18 MED ORDER — BUPROPION HCL ER (XL) 150 MG PO TB24: 150.0000 mg | ORAL_TABLET | Freq: Every day | ORAL | 3 refills | Status: DC

## 2023-02-18 MED ORDER — FLUOXETINE HCL 20 MG PO CAPS: 20.0000 mg | ORAL_CAPSULE | Freq: Every day | ORAL | 3 refills | Status: DC

## 2023-02-18 MED ORDER — TRELEGY ELLIPTA 100-62.5-25 MCG/ACT IN AEPB: 1.0000 | INHALATION_SPRAY | Freq: Every day | RESPIRATORY_TRACT | 2 refills | Status: AC

## 2023-02-18 NOTE — Assessment & Plan Note (Signed)
Patient Counseling(The following topics were reviewed):  Preventative care handout given to pt  Health maintenance and immunizations reviewed. Please refer to Health maintenance section. Pt advised on safe sex, wearing seatbelts in car, and proper nutrition labwork ordered today for annual Dental health: Discussed importance of regular tooth brushing, flossing, and dental visits.  Pt will decide if she would like to get shingles and tetanus aware she is overdue.

## 2023-02-18 NOTE — Patient Instructions (Addendum)
  Look on [email or web address] and see if you can find a marriage Equities trader. You can sort this by insurance as well.   Restart bupropion 150 mg once daily along with prozac Restart trelegy daily inhaler Use albuterol as needed.   Schedule dental exam   ------------------------------------  I have sent an electronic order over to your preferred location for the following:  Schedule this after 03/28/2023  []   2D Mammogram  [x]   3D Mammogram  []   Bone Density   Please give this center a call to get scheduled at your convenience.   [x]   [facility]      [address] [place], [place]        [phone]         Make sure to wear two piece  clothing  No lotions powders or deodorants the day of the appointment Make sure to bring picture ID and insurance card.  Bring list of medications you are currently taking including any supplements.   ------------------------------------  Stop by the lab prior to leaving today. I will notify you of your results once received.   Recommendations on keeping yourself healthy:  - Exercise at least 30-45 minutes a day, 3-4 days a week.  - Eat a low-fat diet with lots of fruits and vegetables, up to 7-9 servings per day.  - Seatbelts can save your life. Wear them always.  - Smoke detectors on every level of your home, check batteries every year.  - Eye Doctor - have an eye exam every 1-2 years  - Safe sex - if you may be exposed to STDs, use a condom.  - Alcohol -  If you drink, do it moderately, less than 2 drinks per day.  - Health Care Power of Attorney. Choose someone to speak for you if you are not able.  - Depression is common in our stressful world.If you're feeling down or losing interest in things you normally enjoy, please come in for a visit.  - Violence - If anyone is threatening or hurting you, please call immediately.  Due to recent changes in healthcare laws, you may see results of your  imaging and/or laboratory studies on MyChart before I have had a chance to review them.  I understand that in some cases there may be results that are confusing or concerning to you. Please understand that not all results are received at the same time and often I may need to interpret multiple results in order to provide you with the best plan of care or course of treatment. Therefore, I ask that you please give me 2 business days to thoroughly review all your results before contacting my office for clarification. Should we see a critical lab result, you will be contacted sooner.   I will see you again in one year for your annual comprehensive exam unless otherwise stated and or with acute concerns.  It was a pleasure seeing you today! Please do not hesitate to reach out with any questions and or concerns.  Regards,   Mort Sawyers

## 2023-02-18 NOTE — Addendum Note (Signed)
Addended by: Donnamarie Poag on: 02/18/2023 09:27 AM   Modules accepted: Orders

## 2023-02-18 NOTE — Assessment & Plan Note (Signed)
Labs ordered for today pending results °

## 2023-02-18 NOTE — Progress Notes (Signed)
Established Patient Office Visit  Subjective:      CC:  Chief Complaint  Patient presents with   Medical Management of Chronic Issues    HPI: Sophia Vance is a 57 y.o. female presenting on 02/18/2023 for Medical Management of Chronic Issues   Anxiety/depression: is taking prozac 20 mg no longer taking bupropion 150 mg once daily. She stopped the bupropion because she was taking it for smoking. She does state she is doing well. She does not see a therapist. Xanax only uses rarely.   She does take lions mane (mushrooms), she started taking this because at times she will be talking and then forget what she was talking about, and the lions mane has been helpful but still occurs here and there. She does feel tired at times, but she can 'pull herself out of the fatigue' and will get the house moving around.   Utd with eye exams  Overdue for dental exams  No known h/o STD  Mammogram: 03/27/22 Bone density: normal  Colonoscopy 2020: due again in seven years   Exercise: no regular routine General diet at home.  Sleeps ok.  Pmp: since 57 y/o   Social history:  Relevant past medical, surgical, family and social history reviewed and updated as indicated. Interim medical history since our last visit reviewed.  Allergies and medications reviewed and updated.  DATA REVIEWED: CHART IN EPIC     ROS: Negative unless specifically indicated above in HPI.    Current Outpatient Medications:    albuterol (VENTOLIN HFA) 108 (90 Base) MCG/ACT inhaler, Inhale 2 puffs into the lungs every 6 (six) hours as needed for wheezing or shortness of breath., Disp: 8 g, Rfl: 0   ALPRAZolam (XANAX) 0.25 MG tablet, Take 1 tablet (0.25 mg total) by mouth daily as needed for anxiety., Disp: 30 tablet, Rfl: 0   buPROPion (WELLBUTRIN XL) 150 MG 24 hr tablet, Take 1 tablet (150 mg total) by mouth daily., Disp: 90 tablet, Rfl: 3   diphenhydrAMINE (BENADRYL) 25 MG tablet, Take 25 mg by mouth 2 (two) times  daily as needed for allergies., Disp: , Rfl:    ibuprofen (ADVIL,MOTRIN) 200 MG tablet, Take 800 mg by mouth every 6 (six) hours as needed for headache (pain)., Disp: , Rfl:    Magnesium Oxide 500 MG TABS, Take by mouth., Disp: , Rfl:    Specialty Vitamins Products ([employer] MENOPAUSE SUPPORT PO), Take by mouth., Disp: , Rfl:    valACYclovir (VALTREX) 1000 MG tablet, TAKE 2 TABLETS BY MOUTH TWICE A DAY FOR ONE DAY FOR COLD SORE, Disp: 30 tablet, Rfl: 1   FLUoxetine (PROZAC) 20 MG capsule, Take 1 capsule (20 mg total) by mouth daily., Disp: 90 capsule, Rfl: 3   Fluticasone-Umeclidin-Vilant (TRELEGY ELLIPTA) 100-62.5-25 MCG/ACT AEPB, Inhale 1 puff into the lungs daily., Disp: 1 each, Rfl: 2      Objective:    BP 124/72   Pulse 77   Temp 98.3 F (36.8 C) (Temporal)   Ht 5\' 5"  (1.651 m)   Wt 159 lb 6.4 oz (72.3 kg)   LMP 03/18/2019 (Approximate)   SpO2 98%   BMI 26.53 kg/m   Wt Readings from Last 3 Encounters:  02/18/23 159 lb 6.4 oz (72.3 kg)  03/14/22 158 lb 6.4 oz (71.8 kg)  03/02/22 159 lb 2 oz (72.2 kg)    Physical Exam  Physical Exam Constitutional:      General: not in acute distress.    Appearance: Normal appearance. normal  weight. is not ill-appearing, toxic-appearing or diaphoretic.  Cardiovascular:     Rate and Rhythm: Normal rate.  Pulmonary:     Effort: Pulmonary effort is normal.  Musculoskeletal:        General: Normal range of motion.  Neurological:     General: No focal deficit present.     Mental Status: alert and oriented to person, place, and time. Mental status is at baseline.  Psychiatric:        Mood and Affect: Mood normal.        Behavior: Behavior normal.        Thought Content: Thought content normal.        Judgment: Judgment normal.        Assessment & Plan:  Brain fog -     Vitamin B12 -     CBC  Anxiety Assessment & Plan: Continue prozac 20 mg once daily Start wellbutrin 150 mg once daily XL   Orders: -     FLUoxetine HCl; Take 1  capsule (20 mg total) by mouth daily.  Dispense: 90 capsule; Refill: 3 -     buPROPion HCl ER (XL); Take 1 tablet (150 mg total) by mouth daily.  Dispense: 90 tablet; Refill: 3  Pulmonary emphysema, unspecified emphysema type Assessment & Plan: Resume trelegy  Albuterol refill sent in  F/u with pulmonary as scheduled.   Orders: -     Trelegy Ellipta; Inhale 1 puff into the lungs daily.  Dispense: 1 each; Refill: 2 -     Albuterol Sulfate HFA; Inhale 2 puffs into the lungs every 6 (six) hours as needed for wheezing or shortness of breath.  Dispense: 8 g; Refill: 0  Short-term memory loss Assessment & Plan: Labs ordered for today pending results  Orders: -     Lipid panel  Mixed hyperlipidemia -     Basic metabolic panel  Vitamin D deficiency -     VITAMIN D 25 Hydroxy (Vit-D Deficiency, Fractures)  Screening mammogram for breast cancer -     3D Screening Mammogram, Left and Right; Future  Current non-smoker  History of cigarette smoking  Encounter for general adult medical examination with abnormal findings Assessment & Plan: Patient Counseling(The following topics were reviewed):  Preventative care handout given to pt  Health maintenance and immunizations reviewed. Please refer to Health maintenance section. Pt advised on safe sex, wearing seatbelts in car, and proper nutrition labwork ordered today for annual Dental health: Discussed importance of regular tooth brushing, flossing, and dental visits.  Pt will decide if she would like to get shingles and tetanus aware she is overdue.      Return in about 1 year (around 02/18/2024) for f/u CPE.  Mort Sawyers, MSN, APRN, FNP-C [facility]

## 2023-02-18 NOTE — Assessment & Plan Note (Signed)
Resume trelegy  Albuterol refill sent in  F/u with pulmonary as scheduled.

## 2023-02-18 NOTE — Assessment & Plan Note (Signed)
Continue prozac 20 mg once daily Start wellbutrin 150 mg once daily XL

## 2023-02-20 NOTE — Progress Notes (Signed)
Vitamin B12 very low, please set up for B12 injections, 1000 mcg IM once monthly for three months, also schedule 3 month f/u appt to repeat labs and f/u in office. Recommend also oral otc 1000 mcg once daily vitamin B12  Your cholesterol is still a bit high, but your triglycerides have improved.  Continue low cholesterol diet and exercise as tolerated.

## 2023-03-06 ENCOUNTER — Ambulatory Visit (INDEPENDENT_AMBULATORY_CARE_PROVIDER_SITE_OTHER): Payer: Commercial Managed Care - PPO | Admitting: Acute Care

## 2023-03-06 ENCOUNTER — Encounter: Payer: Self-pay | Admitting: Acute Care

## 2023-03-06 DIAGNOSIS — Z87891 Personal history of nicotine dependence: Secondary | ICD-10-CM

## 2023-03-06 NOTE — Progress Notes (Signed)
Virtual Visit via Telephone Note  I connected with Erlean Mealor Simson on 03/06/23 at  8:30 AM EDT by telephone and verified that I am speaking with the correct person using two identifiers.  Location: Patient:  At home Provider: [address], [place], [place], [address]    I discussed the limitations, risks, security and privacy concerns of performing an evaluation and management service by telephone and the availability of in person appointments. I also discussed with the patient that there may be a patient responsible charge related to this service. The patient expressed understanding and agreed to proceed.   Shared Decision Making Visit Lung Cancer Screening Program [phone])   Eligibility: Age 57 y.o. Pack Years Smoking History Calculation 41 pack year smoking history (# packs/per year x # years smoked) Recent History of coughing up blood  no Unexplained weight loss? no ( >Than 15 pounds within the last 6 months ) Prior History Lung / other cancer no (Diagnosis within the last 5 years already requiring surveillance chest CT Scans). Smoking Status Former Smoker Former Smokers: Years since quit: 2 years  Quit Date: 2022  Visit Components: Discussion included one or more decision making aids. yes Discussion included risk/benefits of screening. yes Discussion included potential follow up diagnostic testing for abnormal scans. yes Discussion included meaning and risk of over diagnosis. yes Discussion included meaning and risk of False Positives. yes Discussion included meaning of total radiation exposure. yes  Counseling Included: Importance of adherence to annual lung cancer LDCT screening. yes Impact of comorbidities on ability to participate in the program. yes Ability and willingness to under diagnostic treatment. yes  Smoking Cessation Counseling: Current Smokers:  Discussed importance of smoking cessation. yes Information about tobacco cessation classes and  interventions provided to patient. yes Patient provided with "ticket" for LDCT Scan. yes Symptomatic Patient. no  Counseling NA Diagnosis Code: Tobacco Use Z72.0 Asymptomatic Patient yes  Counseling (Intermediate counseling: > three minutes counseling) N8295 Former Smokers:  Discussed the importance of maintaining cigarette abstinence. yes Diagnosis Code: Personal History of Nicotine Dependence. A21.308 Information about tobacco cessation classes and interventions provided to patient. Yes Patient provided with "ticket" for LDCT Scan. yes Written Order for Lung Cancer Screening with LDCT placed in Epic. Yes (CT Chest Lung Cancer Screening Low Dose W/O CM) [id number] Z12.2-Screening of respiratory organs Z87.891-Personal history of nicotine dependence  I spent 25 minutes of face to face time/virtual visit time  with  Ms. Fornwalt discussing the risks and benefits of lung cancer screening. We took the time to pause the power point at intervals to allow for questions to be asked and answered to ensure understanding. We discussed that she had taken the single most powerful action possible to decrease her risk of developing lung cancer when she quit smoking. I counseled her to remain smoke free, and to contact me if she ever had the desire to smoke again so that I can provide resources and tools to help support the effort to remain smoke free. We discussed the time and location of the scan, and that either  Abigail Miyamoto RN, Karlton Lemon, RN or I  or I will call / send a letter with the results within  24-72 hours of receiving them. she has the office contact information in the event she needs to speak with me,  she verbalized understanding of all of the above and had no further questions upon leaving the office.     I explained to the patient that there has  been a high incidence of coronary artery disease noted on these exams. I explained that this is a non-gated exam therefore degree or severity cannot be  determined. This patient is not on statin therapy. I have asked the patient to follow-up with their PCP regarding any incidental finding of coronary artery disease and management with diet or medication as they feel is clinically indicated. The patient verbalized understanding of the above and had no further questions.     Bevelyn Ngo, NP 03/06/2023

## 2023-03-06 NOTE — Patient Instructions (Signed)
Thank you for participating in the [facility] Scre[facility] Program. It was our pleasure to meet you today. We will call you with the results of your scan within the next few days. Your scan will be assigned a Lung RADS category score by the physicians reading the scans.  This Lung RADS score determines follow up scanning.  See below for description of categories, and follow up screening recommendations. We will be in touch to schedule your follow up screening annually or based on recommendations of our providers. We will fax a copy of your scan results to your Primary Care Physician, or the physician who referred you to the program, to ensure they have the results. Please call the office if you have any questions or concerns regarding your scanning experience or results.  Our office number is [phone]. Please speak with Denise Phelps, RN. , or  Denise Buckner RN, They are  our Lung Cancer Screening RN.'s If They are unavailable when you call, Please leave a message on the voice mail. We will return your call at our earliest convenience.This voice mail is monitored several times a day.  Remember, if your scan is normal, we will scan you annually as long as you continue to meet the criteria for the program. (Age 50-80, Current smoker or smoker who has quit within the last 15 years). If you are a smoker, remember, quitting is the single most powerful action that you can take to decrease your risk of lung cancer and other pulmonary, breathing related problems. We know quitting is hard, and we are here to help.  Please let us know if there is anything we can do to help you meet your goal of quitting. If you are a former smoker, congratulations. We are proud of you! Remain smoke free! Remember you can refer friends or family members through the number above.  We will screen them to make sure they meet criteria for the program. Thank you for helping us take better care of you by  participating in Lung Screening.  You can receive free nicotine replacement therapy ( patches, gum or mints) by calling 1-800-QUIT NOW. Please call so we can get you on the path to becoming  a non-smoker. I know it is hard, but you can do this!  Lung RADS Categories:  Lung RADS 1: no nodules or definitely non-concerning nodules.  Recommendation is for a repeat annual scan in 12 months.  Lung RADS 2:  nodules that are non-concerning in appearance and behavior with a very low likelihood of becoming an active cancer. Recommendation is for a repeat annual scan in 12 months.  Lung RADS 3: nodules that are probably non-concerning , includes nodules with a low likelihood of becoming an active cancer.  Recommendation is for a 6-month repeat screening scan. Often noted after an upper respiratory illness. We will be in touch to make sure you have no questions, and to schedule your 6-month scan.  Lung RADS 4 A: nodules with concerning findings, recommendation is most often for a follow up scan in 3 months or additional testing based on our provider's assessment of the scan. We will be in touch to make sure you have no questions and to schedule the recommended 3 month follow up scan.  Lung RADS 4 B:  indicates findings that are concerning. We will be in touch with you to schedule additional diagnostic testing based on our provider's  assessment of the scan.  Other options for assistance in smoking cessation (   As covered by your insurance benefits)  Hypnosis for smoking cessation  [employer] [phone]  Acupuncture for smoking cessation  [facility] [phone]   

## 2023-03-12 ENCOUNTER — Ambulatory Visit
Admission: RE | Admit: 2023-03-12 | Discharge: 2023-03-12 | Disposition: A | Discharge status: Home / Self Care | Payer: Commercial Managed Care - PPO | Source: Ambulatory Visit | Attending: Acute Care | Admitting: Acute Care

## 2023-03-12 DIAGNOSIS — Z122 Encounter for screening for malignant neoplasm of respiratory organs: Secondary | ICD-10-CM

## 2023-03-12 DIAGNOSIS — Z87891 Personal history of nicotine dependence: Secondary | ICD-10-CM

## 2023-03-15 ENCOUNTER — Other Ambulatory Visit: Payer: Self-pay | Admitting: Acute Care

## 2023-03-15 ENCOUNTER — Telehealth: Payer: Self-pay | Admitting: Family

## 2023-03-15 DIAGNOSIS — Z87891 Personal history of nicotine dependence: Secondary | ICD-10-CM

## 2023-03-15 DIAGNOSIS — Z122 Encounter for screening for malignant neoplasm of respiratory organs: Secondary | ICD-10-CM

## 2023-03-15 DIAGNOSIS — F419 Anxiety disorder, unspecified: Secondary | ICD-10-CM

## 2023-03-15 NOTE — Telephone Encounter (Signed)
Patient called in and stated that she accidentally threw her FLUoxetine (PROZAC) 20 MG capsule out. She stated that she tried to go on without them but she is needing them. Please advise. Thank you!

## 2023-03-15 NOTE — Telephone Encounter (Signed)
Spoke with the pharmacy and was advised that there was a pick up in December 2023 for 90 days, and April 2024 for 10 day. There is another 90 day prescription that is available for pick up. I informed the pharmacist to go ahead and fill that medication and I will reach out to the patient.   Spoke with the patient and advised that she can pick up her medication/Prozac today. Pt understood.

## 2023-07-18 ENCOUNTER — Other Ambulatory Visit: Payer: Self-pay | Admitting: Family

## 2023-07-18 DIAGNOSIS — B009 Herpesviral infection, unspecified: Secondary | ICD-10-CM

## 2024-04-07 ENCOUNTER — Other Ambulatory Visit: Payer: Self-pay | Admitting: Acute Care

## 2024-04-07 DIAGNOSIS — Z87891 Personal history of nicotine dependence: Secondary | ICD-10-CM

## 2024-04-07 DIAGNOSIS — Z122 Encounter for screening for malignant neoplasm of respiratory organs: Secondary | ICD-10-CM

## 2024-04-14 ENCOUNTER — Ambulatory Visit: Admitting: Nurse Practitioner

## 2024-04-15 ENCOUNTER — Other Ambulatory Visit: Payer: Self-pay

## 2024-04-15 ENCOUNTER — Ambulatory Visit (INDEPENDENT_AMBULATORY_CARE_PROVIDER_SITE_OTHER)
Admission: RE | Admit: 2024-04-15 | Discharge: 2024-04-15 | Disposition: A | Discharge status: Home / Self Care | Source: Ambulatory Visit | Attending: General Practice | Admitting: General Practice

## 2024-04-15 ENCOUNTER — Encounter: Payer: Self-pay | Admitting: General Practice

## 2024-04-15 ENCOUNTER — Ambulatory Visit: Payer: Self-pay | Admitting: General Practice

## 2024-04-15 ENCOUNTER — Ambulatory Visit (INDEPENDENT_AMBULATORY_CARE_PROVIDER_SITE_OTHER): Admitting: General Practice

## 2024-04-15 VITALS — BP 116/80 | HR 76 | Temp 98.2°F | Ht 64.0 in | Wt 143.0 lb

## 2024-04-15 DIAGNOSIS — R300 Dysuria: Secondary | ICD-10-CM | POA: Diagnosis not present

## 2024-04-15 DIAGNOSIS — R1084 Generalized abdominal pain: Secondary | ICD-10-CM

## 2024-04-15 DIAGNOSIS — R350 Frequency of micturition: Secondary | ICD-10-CM | POA: Insufficient documentation

## 2024-04-15 DIAGNOSIS — R110 Nausea: Secondary | ICD-10-CM | POA: Diagnosis not present

## 2024-04-15 DIAGNOSIS — F339 Major depressive disorder, recurrent, unspecified: Secondary | ICD-10-CM

## 2024-04-15 DIAGNOSIS — F419 Anxiety disorder, unspecified: Secondary | ICD-10-CM

## 2024-04-15 LAB — POC URINALSYSI DIPSTICK (AUTOMATED)
Bilirubin, UA: NEGATIVE
Blood, UA: NEGATIVE
Glucose, UA: NEGATIVE
Ketones, UA: NEGATIVE
Leukocytes, UA: NEGATIVE
Nitrite, UA: NEGATIVE
Protein, UA: NEGATIVE
Spec Grav, UA: 1.01 — AB (ref 1.010–1.025)
Urobilinogen, UA: 0.2 U/dL
pH, UA: 8.5 — AB (ref 5.0–8.0)

## 2024-04-15 MED ORDER — ONDANSETRON 4 MG PO TBDP: 4.0000 mg | ORAL_TABLET | Freq: Three times a day (TID) | ORAL | 0 refills | Status: DC | PRN

## 2024-04-15 NOTE — Progress Notes (Signed)
 Established Patient Office Visit  Subjective   Patient ID: Sophia Vance, female    DOB: [birth year]  Age: 58 y.o. MRN: [id number]  Chief Complaint  Patient presents with   Abdominal Pain    Has had issues for awhile intermittently. The last 2 weeks has been daily. States pain is all over. Has been taking Align. Has had urinary urgency and frequency the last 2 weeks. Slight dysuria.     HPI  Sophia Vance is a 58 year old female, patient of Sophia Horns, FNP, with past medical history of pulmonary emphysema, GERD, left adrenal adenoma, anxiety, depression, post menopausal, HLD presents today for an acute visit.   Abdominal pain: Symptom onset was two weeks. Pain is generalized with urinary urgency, dysuria and frequency. Touching and palpating make it worse. She did have some nausea, lightheaded for the last 24 hours. She has chronic back pain but she does not think it is related to this. No visible blood in urine. Intermittent lower abdominal pressure. Describes pain is dull, achy but intermittent sharp and shooting pain. She usually has a BM daily but then she thought she was constipated and took a laxative and had a good BM. Her appetite is still the same. She has felt bloated. She has tried pepto-bismol which seems to help her symptoms. She has also tried align with some relief. She does drink alcohol 1-2 times a week. Usually 3 beers or one mixed drink. Pain is not worse after certain foods. No recent use of NSAIDs.   No fever, chills, vomiting, diarrhea, constipation, chest pain, shortness of breath or difficulty breathing.    Patient Active Problem List   Diagnosis Date Noted   Generalized abdominal pain 04/15/2024   Urinary frequency 04/15/2024   Nausea 04/15/2024   Dysuria 04/15/2024   Brain fog 02/18/2023   Short-term memory loss 02/18/2023   Current non-smoker 02/18/2023   Vitamin D  deficiency 02/18/2023   History of cigarette smoking 02/18/2023   Encounter for general  adult medical examination with abnormal findings 02/18/2023   Anxiety 03/02/2022   Pulmonary emphysema (HCC) 03/02/2022   Vasomotor symptoms due to menopause 03/02/2022   Adrenal adenoma, left 03/02/2022   Multiple pulmonary nodules 03/02/2022   Mixed hyperlipidemia 03/02/2022   Post-menopausal 03/02/2022   Hyperglycemia 03/02/2022   Recurrent cold sores 12/28/2020   GERD (gastroesophageal reflux disease) 08/19/2019   Depression, recurrent (HCC) 09/07/2010   Past Medical History:  Diagnosis Date   Anxiety    Chest pain    COPD (chronic obstructive pulmonary disease) (HCC)    COVID-19 05/2019   Past Surgical History:  Procedure Laterality Date   APPENDECTOMY     BREAST EXCISIONAL BIOPSY Left    BREAST SURGERY     Biopsy neg.   No Known Allergies        No data to display              No data to display            Review of Systems  Constitutional:  Negative for chills and fever.  Respiratory:  Negative for cough and shortness of breath.   Cardiovascular:  Negative for chest pain.  Gastrointestinal:  Positive for nausea. Negative for abdominal pain, constipation, diarrhea, heartburn and vomiting.  Genitourinary:  Positive for dysuria and frequency. Negative for flank pain, hematuria and urgency.  Neurological:  Positive for dizziness. Negative for headaches.  Endo/Heme/Allergies:  Negative for polydipsia.  Psychiatric/Behavioral:  Negative for depression and  suicidal ideas. The patient is not nervous/anxious.       Objective:     BP 116/80 (BP Location: Left Arm, Patient Position: Sitting, Cuff Size: Normal)   Pulse 76   Temp 98.2 F (36.8 C) (Oral)   Ht 5' 4 (1.626 m)   Wt 143 lb (64.9 kg)   LMP 03/18/2019 (Approximate)   SpO2 99%   BMI 24.55 kg/m  BP Readings from Last 3 Encounters:  04/15/24 116/80  02/18/23 124/72  03/14/22 132/90   Wt Readings from Last 3 Encounters:  04/15/24 143 lb (64.9 kg)  02/18/23 159 lb 6.4 oz (72.3 kg)  03/14/22  158 lb 6.4 oz (71.8 kg)      Physical Exam Vitals and nursing note reviewed.  Constitutional:      Appearance: Normal appearance.  Cardiovascular:     Rate and Rhythm: Normal rate and regular rhythm.     Pulses: Normal pulses.     Heart sounds: Normal heart sounds.  Pulmonary:     Effort: Pulmonary effort is normal.     Breath sounds: Normal breath sounds.  Abdominal:     General: Bowel sounds are decreased. There is no distension.     Palpations: Abdomen is soft.     Tenderness: There is generalized abdominal tenderness and tenderness in the right upper quadrant, right lower quadrant, suprapubic area, left upper quadrant and left lower quadrant. There is no right CVA tenderness or left CVA tenderness.  Skin:    General: Skin is warm.  Neurological:     Mental Status: She is alert and oriented to person, place, and time.  Psychiatric:        Mood and Affect: Mood normal.        Behavior: Behavior normal.        Thought Content: Thought content normal.        Judgment: Judgment normal.      Results for orders placed or performed in visit on 04/15/24  POCT Urinalysis Dipstick (Automated)  Result Value Ref Range   Color, UA light yellow    Clarity, UA clear    Glucose, UA Negative Negative   Bilirubin, UA negative    Ketones, UA negative    Spec Grav, UA 1.010 (A) 1.010 - 1.025   Blood, UA negative    pH, UA 8.5 (A) 5.0 - 8.0   Protein, UA Negative Negative   Urobilinogen, UA 0.2 0.2 or 1.0 E.U./dL   Nitrite, UA negative    Leukocytes, UA Negative Negative       The 10-year ASCVD risk score (Arnett DK, et al., 2019) is: 1.6%    Assessment & Plan:  Urinary frequency Assessment & Plan: POC UA negative for leuk's, nitrites, blood, protein or glucose.  Urine culture pending given her symptoms.   Stable for outpatient treatment.  Discussed to increase water intake and cranberry juice.  Ok to use Azo if needed. Await results.  Orders: -     POCT Urinalysis  Dipstick (Automated) -     Urine Culture  Generalized abdominal pain Assessment & Plan: Unclear etiology.   Differentials include stool burden secondary to constipation, pancreatitis, IBS.  Reviewed colonoscopy report from 2020- she had one polyp removed.  Labs pending to rule out acute concerns.  Discussed to continue probiotic daily and pepto-bismol as needed.  Advised to avoid laxative but ok to use stool softener.   Abdominal x-ray STAT pending. Await results. F/u with pcp in 1-2 weeks once the patient is  back in town and symptoms are still ongoing.  Orders: -     DG Abd 2 Views -     CBC with Differential/Platelet -     Comprehensive metabolic panel with GFR -     Lipase -     Urine Culture  Dysuria Assessment & Plan: POC UA negative for leuk's, nitrites, blood, protein or glucose.  Urine culture pending given her symptoms.   Stable for outpatient treatment.  Discussed to increase water intake and cranberry juice.  Ok to use Azo if needed. Await results.  Orders: -     Urine Culture  Nausea Assessment & Plan: Rx sent for zofran.  Orders: -     Ondansetron; Take 1 tablet (4 mg total) by mouth every 8 (eight) hours as needed.  Dispense: 20 tablet; Refill: 0     Return for 1-2 weeks with PCP for abdominal pain.Jolanda Nation, NP

## 2024-04-15 NOTE — Assessment & Plan Note (Signed)
 POC UA negative for leuk's, nitrites, blood, protein or glucose.  Urine culture pending given her symptoms.   Stable for outpatient treatment.  Discussed to increase water intake and cranberry juice.  Ok to use Azo if needed. Await results.

## 2024-04-15 NOTE — Patient Instructions (Addendum)
 Stop by the lab prior to leaving today. I will notify you of your results once received.   Complete xray(s) prior to leaving today. I will notify you of your results once received.  Increase your water intake.  Continue align daily and pepto as needed.   I have been sent in the zofran for nausea.   F/u in 1-2 weeks with PCP.   It was a pleasure to see you today!Sophia Vance

## 2024-04-15 NOTE — Assessment & Plan Note (Signed)
 Rx sent for zofran

## 2024-04-15 NOTE — Assessment & Plan Note (Signed)
 Unclear etiology.   Differentials include stool burden secondary to constipation, pancreatitis, IBS.  Reviewed colonoscopy report from 2020- she had one polyp removed.  Labs pending to rule out acute concerns.  Discussed to continue probiotic daily and pepto-bismol as needed.  Advised to avoid laxative but ok to use stool softener.   Abdominal x-ray STAT pending. Await results. F/u with pcp in 1-2 weeks once the patient is back in town and symptoms are still ongoing.

## 2024-04-15 NOTE — Assessment & Plan Note (Addendum)
 POC UA negative for leuk's, nitrites, blood, protein or glucose.  Urine culture pending given her symptoms.   Stable for outpatient treatment.  Discussed to increase water intake and cranberry juice.  Ok to use Azo if needed. Await results.

## 2024-04-16 LAB — CBC WITH DIFFERENTIAL/PLATELET
Absolute Lymphocytes: 2218 {cells}/uL (ref 850–3900)
Absolute Monocytes: 386 {cells}/uL (ref 200–950)
Basophils Absolute: 40 {cells}/uL (ref 0–200)
Basophils Relative: 0.4 %
Eosinophils Absolute: 109 {cells}/uL (ref 15–500)
Eosinophils Relative: 1.1 %
HCT: 42.1 % (ref 35.0–45.0)
Hemoglobin: 14.1 g/dL (ref 11.7–15.5)
MCH: 31.3 pg (ref 27.0–33.0)
MCHC: 33.5 g/dL (ref 32.0–36.0)
MCV: 93.6 fL (ref 80.0–100.0)
MPV: 10.3 fL (ref 7.5–12.5)
Monocytes Relative: 3.9 %
Neutro Abs: 7148 {cells}/uL (ref 1500–7800)
Neutrophils Relative %: 72.2 %
Platelets: 290 10*3/uL (ref 140–400)
RBC: 4.5 10*6/uL (ref 3.80–5.10)
RDW: 12.1 % (ref 11.0–15.0)
Total Lymphocyte: 22.4 %
WBC: 9.9 10*3/uL (ref 3.8–10.8)

## 2024-04-16 LAB — COMPREHENSIVE METABOLIC PANEL WITH GFR
AG Ratio: 2.1 (calc) (ref 1.0–2.5)
ALT: 10 U/L (ref 6–29)
AST: 13 U/L (ref 10–35)
Albumin: 4.6 g/dL (ref 3.6–5.1)
Alkaline phosphatase (APISO): 80 U/L (ref 37–153)
BUN/Creatinine Ratio: 8 (calc) (ref 6–22)
BUN: 6 mg/dL — ABNORMAL LOW (ref 7–25)
CO2: 25 mmol/L (ref 20–32)
Calcium: 9.4 mg/dL (ref 8.6–10.4)
Chloride: 103 mmol/L (ref 98–110)
Creat: 0.78 mg/dL (ref 0.50–1.03)
Globulin: 2.2 g/dL (ref 1.9–3.7)
Glucose, Bld: 102 mg/dL — ABNORMAL HIGH (ref 65–99)
Potassium: 3.9 mmol/L (ref 3.5–5.3)
Sodium: 143 mmol/L (ref 135–146)
Total Bilirubin: 0.4 mg/dL (ref 0.2–1.2)
Total Protein: 6.8 g/dL (ref 6.1–8.1)
eGFR: 89 mL/min/{1.73_m2} (ref >=60)

## 2024-04-16 LAB — URINE CULTURE
MICRO NUMBER:: 16566996
Result:: NO GROWTH
SPECIMEN QUALITY:: ADEQUATE

## 2024-04-16 LAB — LIPASE: Lipase: 20 U/L (ref 7–60)

## 2024-04-21 ENCOUNTER — Other Ambulatory Visit: Payer: Self-pay | Admitting: Family

## 2024-04-21 DIAGNOSIS — F419 Anxiety disorder, unspecified: Secondary | ICD-10-CM

## 2024-04-22 ENCOUNTER — Ambulatory Visit: Admitting: Family

## 2024-04-30 ENCOUNTER — Telehealth: Payer: Self-pay | Admitting: Family

## 2024-04-30 ENCOUNTER — Other Ambulatory Visit: Payer: Self-pay | Admitting: Family

## 2024-04-30 ENCOUNTER — Encounter: Payer: Self-pay | Admitting: Family

## 2024-04-30 ENCOUNTER — Ambulatory Visit (INDEPENDENT_AMBULATORY_CARE_PROVIDER_SITE_OTHER): Admitting: Family

## 2024-04-30 VITALS — BP 136/70 | HR 92 | Temp 98.3°F | Ht 64.0 in | Wt 143.2 lb

## 2024-04-30 DIAGNOSIS — F419 Anxiety disorder, unspecified: Secondary | ICD-10-CM

## 2024-04-30 DIAGNOSIS — R1030 Lower abdominal pain, unspecified: Secondary | ICD-10-CM | POA: Diagnosis not present

## 2024-04-30 MED ORDER — BUPROPION HCL ER (XL) 150 MG PO TB24: 150.0000 mg | ORAL_TABLET | Freq: Every day | ORAL | 3 refills | Status: AC

## 2024-04-30 MED ORDER — FLUOXETINE HCL 20 MG PO CAPS: 20.0000 mg | ORAL_CAPSULE | Freq: Every day | ORAL | 3 refills | Status: AC

## 2024-04-30 NOTE — Assessment & Plan Note (Signed)
 Reviewed xray K[facility] last visit, moderate stool in colon  Advised pt to start mirlax 1 tsp daily until regular bowel movement.  Due to pain on palpation 10/10 will obtain CT abd pelvis Discussed ER precautions Ddx diverticulitis, colon obstruction/constipation without obstruction, less likely neoplasm

## 2024-04-30 NOTE — Telephone Encounter (Signed)
 Copied from CRM [id number]. Topic: Clinical - Medication Refill >> Apr 30, 2024 11:41 AM Berneda FALCON wrote: Medication:  FLUoxetine  (PROZAC ) 20 MG capsule  buPROPion  (WELLBUTRIN  XL) 150 MG 24 hr tablet (out over a week)   Has the patient contacted their pharmacy? Yes (Agent: If no, request that the patient contact the pharmacy for the refill. If patient does not wish to contact the pharmacy document the reason why and proceed with request.) (Agent: If yes, when and what did the pharmacy advise?)  This is the patient's preferred pharmacy:  [facility] - [place], [place] - [address] [place] [address] Phone: [phone] Fax: [phone]  Is this the correct pharmacy for this prescription? Yes If no, delete pharmacy and type the correct one.   Has the prescription been filled recently? No  Is the patient out of the medication? Yes  Has the patient been seen for an appointment in the last year OR does the patient have an upcoming appointment? Yes  Can we respond through MyChart? Yes  Agent: Please be advised that Rx refills may take up to 3 business days. We ask that you follow-up with your pharmacy.

## 2024-04-30 NOTE — Patient Instructions (Addendum)
 ------------------------------------   Add fiber supplement once daily.  Add a probiotic (such as Florastor) daily. Drink 64 oz of water a day. Eat lots of fresh fruit and veggies. Ensure regular exercise.    If you are not able to have regular BM's with the above regimen, you may add miralax 1 tablespoon daily.  Increase or decrease amount/frequency as needed to ensure 1 soft BM/day.   ------------------------------------   

## 2024-04-30 NOTE — Progress Notes (Signed)
 Established Patient Office Visit  Subjective:   Patient ID: Sophia Vance, female    DOB: November 01, 1966  Age: 58 y.o. MRN: [id number]  CC:  Chief Complaint  Patient presents with   Medical Management of Chronic Issues    Here for 1-2 wk abd pain f/u.    HPI: Sophia Vance is a 58 y.o. female presenting on 04/30/2024 for Medical Management of Chronic Issues (Here for 1-2 wk abd pain f/u.)  Here for two week f/u abdominal pain. She saw Carrol Aurora, NP two weeks ago for urinary urgency dysuria and frequency with lower abdominal pain. Xray during that viist 6/11 with constipation. Cbc wnl and cmp no acute findings. She is taking probiotic daily, increasing her fib, and is using a stool softener. Urine negative for infection. No longer with as much abdominal pain and also with regular bowel movements. She does report some occasional abdominal cramping but is not daily. Denies mucous in the stool.         ROS: Negative unless specifically indicated above in HPI.   Relevant past medical history reviewed and updated as indicated.   Allergies and medications reviewed and updated.   Current Outpatient Medications:    albuterol  (VENTOLIN  HFA) 108 (90 Base) MCG/ACT inhaler, Inhale 2 puffs into the lungs every 6 (six) hours as needed for wheezing or shortness of breath., Disp: 8 g, Rfl: 0   ALPRAZolam  (XANAX ) 0.25 MG tablet, Take 1 tablet (0.25 mg total) by mouth daily as needed for anxiety., Disp: 30 tablet, Rfl: 0   buPROPion  (WELLBUTRIN  XL) 150 MG 24 hr tablet, TAKE ONE TABLET (150 MG TOTAL) BY MOUTH DAILY., Disp: 90 tablet, Rfl: 3   Cholecalciferol (VITAMIN D -3) 125 MCG (5000 UT) TABS, Take 1 tablet by mouth daily., Disp: , Rfl:    diphenhydrAMINE (BENADRYL) 25 MG tablet, Take 25 mg by mouth 2 (two) times daily as needed for allergies., Disp: , Rfl:    Docusate Calcium (STOOL SOFTENER PO), Take by mouth daily., Disp: , Rfl:    FLUoxetine  (PROZAC ) 20 MG capsule, Take 1 capsule (20 mg  total) by mouth daily., Disp: 90 capsule, Rfl: 3   Fluticasone-Umeclidin-Vilant (TRELEGY ELLIPTA ) 100-62.5-25 MCG/ACT AEPB, Inhale 1 puff into the lungs daily., Disp: 1 each, Rfl: 2   ibuprofen  (ADVIL ,MOTRIN ) 200 MG tablet, Take 800 mg by mouth every 6 (six) hours as needed for headache (pain)., Disp: , Rfl:    Magnesium Oxide 500 MG TABS, Take by mouth., Disp: , Rfl:    ondansetron  (ZOFRAN -ODT) 4 MG disintegrating tablet, Take 1 tablet (4 mg total) by mouth every 8 (eight) hours as needed., Disp: 20 tablet, Rfl: 0   Probiotic Product (ALIGN PO), Take by mouth., Disp: , Rfl:    Specialty Vitamins Products ([employer] MENOPAUSE SUPPORT PO), Take by mouth., Disp: , Rfl:    valACYclovir  (VALTREX ) 1000 MG tablet, TAKE TWO TABLETS BY MOUTH TWO TIMES A DAY FOR ONE DAY FOR COLD SORE, Disp: 30 tablet, Rfl: 1  No Known Allergies  Objective:   BP 136/70   Pulse 92   Temp 98.3 F (36.8 C) (Oral)   Ht 5' 4 (1.626 m)   Wt 143 lb 4 oz (65 kg)   LMP 03/18/2019 (Approximate)   SpO2 98%   BMI 24.59 kg/m    Physical Exam Vitals reviewed.  Constitutional:      General: She is not in acute distress.    Appearance: Normal appearance. She is normal weight. She is not ill-appearing,  toxic-appearing or diaphoretic.  HENT:     Head: Normocephalic.   Cardiovascular:     Rate and Rhythm: Normal rate.  Pulmonary:     Effort: Pulmonary effort is normal.  Abdominal:     General: Bowel sounds are increased.     Tenderness: There is abdominal tenderness in the left lower quadrant. There is rebound. There is no guarding. Negative signs include McBurney's sign.     Hernia: No hernia is present.   Musculoskeletal:        General: Normal range of motion.   Neurological:     General: No focal deficit present.     Mental Status: She is alert and oriented to person, place, and time. Mental status is at baseline.   Psychiatric:        Mood and Affect: Mood normal.        Behavior: Behavior normal.         Thought Content: Thought content normal.        Judgment: Judgment normal.     Assessment & Plan:  Lower abdominal pain Assessment & Plan: Reviewed xray KUB last visit, moderate stool in colon  Advised pt to start mirlax 1 tsp daily until regular bowel movement.  Due to pain on palpation 10/10 will obtain CT abd pelvis Discussed ER precautions Ddx diverticulitis, colon obstruction/constipation without obstruction, less likely neoplasm  Orders: -     CT ABDOMEN PELVIS W CONTRAST; Future     Follow up plan: Return if symptoms worsen or fail to improve.  Ginger Patrick, FNP

## 2024-05-01 ENCOUNTER — Other Ambulatory Visit

## 2024-05-04 ENCOUNTER — Ambulatory Visit
Admission: RE | Admit: 2024-05-04 | Discharge: 2024-05-04 | Disposition: A | Discharge status: Home / Self Care | Source: Ambulatory Visit | Attending: Acute Care | Admitting: Acute Care

## 2024-05-04 DIAGNOSIS — Z87891 Personal history of nicotine dependence: Secondary | ICD-10-CM

## 2024-05-04 DIAGNOSIS — Z122 Encounter for screening for malignant neoplasm of respiratory organs: Secondary | ICD-10-CM

## 2024-05-06 ENCOUNTER — Ambulatory Visit
Admission: RE | Admit: 2024-05-06 | Discharge: 2024-05-06 | Disposition: A | Discharge status: Home / Self Care | Source: Ambulatory Visit | Attending: Family

## 2024-05-06 ENCOUNTER — Encounter: Payer: Self-pay | Admitting: Radiology

## 2024-05-06 DIAGNOSIS — R1030 Lower abdominal pain, unspecified: Secondary | ICD-10-CM

## 2024-05-06 MED ORDER — IOPAMIDOL (ISOVUE-300) INJECTION 61%
100.0000 mL | Freq: Once | INTRAVENOUS | Status: AC | PRN
  Administered 2024-05-06: 100 mL via INTRAVENOUS

## 2024-05-07 ENCOUNTER — Ambulatory Visit: Payer: Self-pay | Admitting: Family

## 2024-05-13 ENCOUNTER — Other Ambulatory Visit: Payer: Self-pay | Admitting: Acute Care

## 2024-05-13 DIAGNOSIS — Z122 Encounter for screening for malignant neoplasm of respiratory organs: Secondary | ICD-10-CM

## 2024-05-13 DIAGNOSIS — Z87891 Personal history of nicotine dependence: Secondary | ICD-10-CM

## 2024-07-01 ENCOUNTER — Other Ambulatory Visit: Payer: Self-pay | Admitting: Family

## 2024-07-01 DIAGNOSIS — B009 Herpesviral infection, unspecified: Secondary | ICD-10-CM

## 2024-07-27 ENCOUNTER — Ambulatory Visit (INDEPENDENT_AMBULATORY_CARE_PROVIDER_SITE_OTHER): Admitting: Family Medicine

## 2024-07-27 ENCOUNTER — Encounter: Payer: Self-pay | Admitting: Family Medicine

## 2024-07-27 ENCOUNTER — Ambulatory Visit: Payer: Self-pay | Admitting: *Deleted

## 2024-07-27 VITALS — BP 118/83 | HR 71 | Temp 98.1°F | Ht 64.0 in | Wt 147.6 lb

## 2024-07-27 DIAGNOSIS — J069 Acute upper respiratory infection, unspecified: Secondary | ICD-10-CM

## 2024-07-27 NOTE — Telephone Encounter (Signed)
 FYI Only or Action Required?: FYI only for provider.  Patient was last seen in primary care on 04/30/2024 by Corwin Antu, FNP.  Called Nurse Triage reporting Sore Throat and Otalgia.  Symptoms began several days ago.  Interventions attempted: Other: OTC Mucinex, Daquil, patient has had  teledoc visit.  Symptoms are: gradually worsening.  Triage Disposition: See HCP Within 4 Hours (Or PCP Triage)  Patient/caregiver understands and will follow disposition?: yes- appointment scheduled at alternative location  Reason for Disposition  [1] SEVERE pain (e.g., excruciating) and [2] not improved 2 hours after pain medicine (e.g., acetaminophen or ibuprofen )  Answer Assessment - Initial Assessment Questions 1. LOCATION: Which ear is involved?     Did tele doc- Saturday am, L ear worse, pain in both 2. ONSET: When did the ear pain start?      Symptoms started Thursday with sore throat 3. SEVERITY: How bad is the pain?  (Scale 1-10; mild, moderate or severe)     10/10 4. URI SYMPTOMS: Do you have a runny nose or cough?     Yes- yes cough 5. FEVER: Do you have a fever? If Yes, ask: What is your temperature, how was it measured, and when did it start?     Low grade 6. CAUSE: Have you been swimming recently?, How often do you use Q-TIPS?, Have you had any recent air travel or scuba diving?     unsure 7. OTHER SYMPTOMS: Do you have any other symptoms? (e.g., decreased hearing, dizziness, headache, stiff neck, vomiting)     Body ache, cough  Protocols used: Earache-A-AH   Copied from CRM #[id number]. Topic: Clinical - Red Word Triage >> Jul 27, 2024  8:33 AM Roselie BROCKS wrote: [facility] that prompted transfer to Nurse Triage: Patient states she is having fever,pain all over, achy, stopped up head ,ear ache, really bad sore throat

## 2024-07-27 NOTE — Assessment & Plan Note (Signed)

## 2024-07-27 NOTE — Telephone Encounter (Signed)
 NOTED

## 2024-07-27 NOTE — Progress Notes (Signed)
 Subjective:  HPI: Sophia Vance is a 58 y.o. female presenting on 07/27/2024 for Acute Visit (Cough, fever, ear pain started this past Thursday symptoms have increased. Teledoc told her to use mucinex and prescribed benzonatate. )   HPI Patient is in today for 5 days of sore throat, cough, fever, ear pain, mild clear rhinorrhea.  Denies SOB, wheezing, N/V/D, pleurisy, mucopurulent cough, sinus pressure or pain She has a coworker who was sick. No flu/covid testing. Has tried mucinex and benzonatate.   Review of Systems  All other systems reviewed and are negative.   Relevant past medical history reviewed and updated as indicated.   Past Medical History:  Diagnosis Date   Anxiety    Chest pain    COPD (chronic obstructive pulmonary disease) (HCC)    COVID-19 05/2019     Past Surgical History:  Procedure Laterality Date   APPENDECTOMY     BREAST EXCISIONAL BIOPSY Left    BREAST SURGERY     Biopsy neg.    Allergies and medications reviewed and updated.   Current Outpatient Medications:    albuterol  (VENTOLIN  HFA) 108 (90 Base) MCG/ACT inhaler, Inhale 2 puffs into the lungs every 6 (six) hours as needed for wheezing or shortness of breath., Disp: 8 g, Rfl: 0   ALPRAZolam  (XANAX ) 0.25 MG tablet, Take 1 tablet (0.25 mg total) by mouth daily as needed for anxiety., Disp: 30 tablet, Rfl: 0   benzonatate (TESSALON) 200 MG capsule, Take 200 mg by mouth 3 (three) times daily as needed for cough., Disp: , Rfl:    buPROPion  (WELLBUTRIN  XL) 150 MG 24 hr tablet, Take 1 tablet (150 mg total) by mouth daily., Disp: 90 tablet, Rfl: 3   Cholecalciferol (VITAMIN D -3) 125 MCG (5000 UT) TABS, Take 1 tablet by mouth daily., Disp: , Rfl:    diphenhydrAMINE (BENADRYL) 25 MG tablet, Take 25 mg by mouth 2 (two) times daily as needed for allergies., Disp: , Rfl:    Docusate Calcium (STOOL SOFTENER PO), Take by mouth daily., Disp: , Rfl:    FLUoxetine  (PROZAC ) 20 MG capsule, Take 1 capsule (20 mg  total) by mouth daily., Disp: 90 capsule, Rfl: 3   Fluticasone-Umeclidin-Vilant (TRELEGY ELLIPTA ) 100-62.5-25 MCG/ACT AEPB, Inhale 1 puff into the lungs daily., Disp: 1 each, Rfl: 2   ibuprofen  (ADVIL ,MOTRIN ) 200 MG tablet, Take 800 mg by mouth every 6 (six) hours as needed for headache (pain)., Disp: , Rfl:    Magnesium Oxide 500 MG TABS, Take by mouth., Disp: , Rfl:    valACYclovir  (VALTREX ) 1000 MG tablet, TAKE TWO TABLETS BY MOUTH TWO TIMES A DAY FOR ONE DAY FOR COLD SORE, Disp: 30 tablet, Rfl: 1  No Known Allergies  Objective:   BP 118/83   Pulse 71   Temp 98.1 F (36.7 C)   Ht 5' 4 (1.626 m)   Wt 147 lb 9.6 oz (67 kg)   LMP 03/18/2019 (Approximate)   SpO2 98%   BMI 25.34 kg/m      07/27/2024    2:01 PM 04/30/2024    9:10 AM 04/15/2024   10:48 AM  Vitals with BMI  Height 5' 4 5' 4 5' 4  Weight 147 lbs 10 oz 143 lbs 4 oz 143 lbs  BMI 25.32 24.58 24.53  Systolic 118 136 883  Diastolic 83 70 80  Pulse 71 92 76     Physical Exam Vitals and nursing note reviewed.  Constitutional:      Appearance: Normal appearance. She  is normal weight.  HENT:     Head: Normocephalic and atraumatic.     Right Ear: Tympanic membrane, ear canal and external ear normal.     Left Ear: Tympanic membrane, ear canal and external ear normal.     Nose: Congestion present.     Right Sinus: Maxillary sinus tenderness present. No frontal sinus tenderness.     Left Sinus: Maxillary sinus tenderness present. No frontal sinus tenderness.     Mouth/Throat:     Mouth: Mucous membranes are moist.     Pharynx: Oropharynx is clear.  Eyes:     Extraocular Movements: Extraocular movements intact.     Conjunctiva/sclera: Conjunctivae normal.     Pupils: Pupils are equal, round, and reactive to light.  Cardiovascular:     Rate and Rhythm: Normal rate and regular rhythm.     Pulses: Normal pulses.     Heart sounds: Normal heart sounds.  Pulmonary:     Effort: Pulmonary effort is normal.     Breath  sounds: Normal breath sounds.  Musculoskeletal:     Cervical back: No tenderness.  Lymphadenopathy:     Cervical: No cervical adenopathy.  Skin:    General: Skin is warm and dry.  Neurological:     General: No focal deficit present.     Mental Status: She is alert and oriented to person, place, and time. Mental status is at baseline.  Psychiatric:        Mood and Affect: Mood normal.        Behavior: Behavior normal.        Thought Content: Thought content normal.        Judgment: Judgment normal.     Assessment & Plan:  Viral URI with cough Assessment & Plan: Reassured patient that symptoms and exam findings are most consistent with a viral upper respiratory infection and explained lack of efficacy of antibiotics against viruses.  Discussed expected course and features suggestive of secondary bacterial infection.  Continue supportive care. Increase fluid intake with water or electrolyte solution like pedialyte. Encouraged acetaminophen as needed for fever/pain. Encouraged salt water gargling, chloraseptic spray and throat lozenges. Encouraged OTC guaifenesin. Encouraged saline sinus flushes and/or neti with humidified air.        Follow up plan: Return if symptoms worsen or fail to improve.  Jeoffrey GORMAN Barrio, FNP

## 2024-07-28 ENCOUNTER — Ambulatory Visit (INDEPENDENT_AMBULATORY_CARE_PROVIDER_SITE_OTHER)
Admission: RE | Admit: 2024-07-28 | Discharge: 2024-07-28 | Disposition: A | Discharge status: Home / Self Care | Source: Ambulatory Visit | Attending: Family | Admitting: Family

## 2024-07-28 ENCOUNTER — Encounter: Payer: Self-pay | Admitting: Family

## 2024-07-28 ENCOUNTER — Ambulatory Visit: Admitting: Family

## 2024-07-28 VITALS — BP 126/92 | HR 85 | Temp 98.5°F | Ht 64.0 in | Wt 148.4 lb

## 2024-07-28 DIAGNOSIS — M25551 Pain in right hip: Secondary | ICD-10-CM

## 2024-07-28 DIAGNOSIS — M533 Sacrococcygeal disorders, not elsewhere classified: Secondary | ICD-10-CM

## 2024-07-28 DIAGNOSIS — M5441 Lumbago with sciatica, right side: Secondary | ICD-10-CM

## 2024-07-28 DIAGNOSIS — E538 Deficiency of other specified B group vitamins: Secondary | ICD-10-CM | POA: Diagnosis not present

## 2024-07-28 DIAGNOSIS — E782 Mixed hyperlipidemia: Secondary | ICD-10-CM | POA: Diagnosis not present

## 2024-07-28 DIAGNOSIS — M5442 Lumbago with sciatica, left side: Secondary | ICD-10-CM

## 2024-07-28 DIAGNOSIS — G8929 Other chronic pain: Secondary | ICD-10-CM

## 2024-07-28 DIAGNOSIS — R739 Hyperglycemia, unspecified: Secondary | ICD-10-CM | POA: Diagnosis not present

## 2024-07-28 DIAGNOSIS — E559 Vitamin D deficiency, unspecified: Secondary | ICD-10-CM

## 2024-07-28 DIAGNOSIS — M545 Low back pain, unspecified: Secondary | ICD-10-CM | POA: Diagnosis not present

## 2024-07-28 DIAGNOSIS — R222 Localized swelling, mass and lump, trunk: Secondary | ICD-10-CM

## 2024-07-28 DIAGNOSIS — M62838 Other muscle spasm: Secondary | ICD-10-CM

## 2024-07-28 LAB — LIPID PANEL
Cholesterol: 181 mg/dL (ref 0–200)
HDL: 60 mg/dL (ref >39.00)
LDL Cholesterol: 99 mg/dL (ref 0–99)
NonHDL: 120.9
Total CHOL/HDL Ratio: 3
Triglycerides: 110 mg/dL (ref 0.0–149.0)
VLDL: 22 mg/dL (ref 0.0–40.0)

## 2024-07-28 LAB — HEMOGLOBIN A1C: Hgb A1c MFr Bld: 5.4 % (ref 4.6–6.5)

## 2024-07-28 LAB — VITAMIN D 25 HYDROXY (VIT D DEFICIENCY, FRACTURES): VITD: 62.96 ng/mL (ref 30.00–100.00)

## 2024-07-28 LAB — VITAMIN B12: Vitamin B-12: 208 pg/mL — ABNORMAL LOW (ref 211–911)

## 2024-07-28 MED ORDER — TIZANIDINE HCL 4 MG PO TABS: ORAL_TABLET | ORAL | 0 refills | Status: AC

## 2024-07-28 MED ORDER — PREDNISONE 10 MG (21) PO TBPK: ORAL_TABLET | ORAL | 0 refills | Status: DC

## 2024-07-28 NOTE — Patient Instructions (Addendum)
  B12 1000 once daily   I have sent in your order electronically for the following: ultrasound lower extremity  at this location below. Please call to schedule the appointment at your convenience  [facility] outpatient imaging center off [address], [place] [address] Phone [phone]-  8-5 pm

## 2024-07-28 NOTE — Progress Notes (Signed)
 Established Patient Office Visit  Subjective:      CC:  Chief Complaint  Patient presents with   Back Pain   Hip Pain    HPI: Sophia Vance is a 58 y.o. female presenting on 07/28/2024 for Back Pain and Hip Pain .  Discussed the use of AI scribe software for clinical note transcription with the patient, who gave verbal consent to proceed.  History of Present Illness Sophia Vance is a 58 year old female who presents with chronic back and hip pain.  She has experienced issues with her back for several years, characterized by 'little knots' in the lower back. These knots are usually not painful but persistent. She previously sought chiropractic care, which did not resolve the issue. She recalls using castor oil packs on her back in the past, which seemed to alleviate the knots temporarily. She recalls that x-rays were taken by her chiropractor, but is unsure about the details, and no ultrasounds have been done for her back issues.  Her primary concern is right hip pain that has been worsening over the past six months, now severe enough to wake her up at night. The pain is described as deep and not typically radiating down the leg, although there have been a few instances where it has. She denies any recent aggravating activities such as lifting or pulling. She has been using ibuprofen  with some relief but has not tried other medications like muscle relaxers or prednisone  before. No urinary symptoms, leg weakness, or recent injuries to the back or hip are reported.  She experiences numbness in the fingers of her right hand, particularly noticeable in the mornings or during the night. She has a history of low B12 levels and has not been taking B12 supplements recently.         Social history:  Relevant past medical, surgical, family and social history reviewed and updated as indicated. Interim medical history since our last visit reviewed.  Allergies and medications  reviewed and updated.  DATA REVIEWED: CHART IN EPIC     ROS: Negative unless specifically indicated above in HPI.    Current Outpatient Medications:    albuterol  (VENTOLIN  HFA) 108 (90 Base) MCG/ACT inhaler, Inhale 2 puffs into the lungs every 6 (six) hours as needed for wheezing or shortness of breath., Disp: 8 g, Rfl: 0   ALPRAZolam  (XANAX ) 0.25 MG tablet, Take 1 tablet (0.25 mg total) by mouth daily as needed for anxiety., Disp: 30 tablet, Rfl: 0   benzonatate (TESSALON) 200 MG capsule, Take 200 mg by mouth 3 (three) times daily as needed for cough., Disp: , Rfl:    buPROPion  (WELLBUTRIN  XL) 150 MG 24 hr tablet, Take 1 tablet (150 mg total) by mouth daily., Disp: 90 tablet, Rfl: 3   Cholecalciferol (VITAMIN D -3) 125 MCG (5000 UT) TABS, Take 1 tablet by mouth daily., Disp: , Rfl:    diphenhydrAMINE (BENADRYL) 25 MG tablet, Take 25 mg by mouth 2 (two) times daily as needed for allergies., Disp: , Rfl:    Docusate Calcium (STOOL SOFTENER PO), Take by mouth daily., Disp: , Rfl:    FLUoxetine  (PROZAC ) 20 MG capsule, Take 1 capsule (20 mg total) by mouth daily., Disp: 90 capsule, Rfl: 3   Fluticasone-Umeclidin-Vilant (TRELEGY ELLIPTA ) 100-62.5-25 MCG/ACT AEPB, Inhale 1 puff into the lungs daily., Disp: 1 each, Rfl: 2   ibuprofen  (ADVIL ,MOTRIN ) 200 MG tablet, Take 800 mg by mouth every 6 (six) hours as needed for headache (pain)., Disp: ,  Rfl:    Magnesium Oxide 500 MG TABS, Take by mouth., Disp: , Rfl:    predniSONE  (STERAPRED UNI-PAK 21 TAB) 10 MG (21) TBPK tablet, Take as directed, Disp: 1 each, Rfl: 0   tiZANidine  (ZANAFLEX ) 4 MG tablet, 1/2 to one tablet po at bedtime prn muscle spasm, Disp: 30 tablet, Rfl: 0   valACYclovir  (VALTREX ) 1000 MG tablet, TAKE TWO TABLETS BY MOUTH TWO TIMES A DAY FOR ONE DAY FOR COLD SORE, Disp: 30 tablet, Rfl: 1        Objective:        BP (!) 126/92 (BP Location: Right Arm, Patient Position: Sitting, Cuff Size: Normal)   Pulse 85   Temp 98.5 F  (36.9 C) (Temporal)   Ht 5' 4 (1.626 m)   Wt 148 lb 6.4 oz (67.3 kg)   LMP 03/18/2019 (Approximate)   SpO2 99%   BMI 25.47 kg/m   Physical Exam MUSCULOSKELETAL: Knots palpated in the lower back.  Wt Readings from Last 3 Encounters:  07/28/24 148 lb 6.4 oz (67.3 kg)  07/27/24 147 lb 9.6 oz (67 kg)  04/30/24 143 lb 4 oz (65 kg)    Physical Exam Vitals reviewed.  Constitutional:      General: She is not in acute distress.    Appearance: Normal appearance. She is normal weight. She is not ill-appearing, toxic-appearing or diaphoretic.  HENT:     Head: Normocephalic.  Cardiovascular:     Rate and Rhythm: Normal rate.  Pulmonary:     Effort: Pulmonary effort is normal.  Musculoskeletal:     Lumbar back: Normal range of motion (pain with external rotation right and left some pain upon flexion). Positive left straight leg raise test.       Back:     Comments: Soft tissue mass palpable slight tenderness on right lower spine as well as another similar mass right lower spine   Regular ROM with external rotation and adduction of right hip  No palpable hip pain   Neurological:     General: No focal deficit present.     Mental Status: She is alert and oriented to person, place, and time. Mental status is at baseline.  Psychiatric:        Mood and Affect: Mood normal.        Behavior: Behavior normal.        Thought Content: Thought content normal.        Judgment: Judgment normal.          Results LABS   Vitamin B12: 160 pg/mL  Assessment & Plan:   Assessment and Plan Assessment & Plan Right hip pain Chronic right hip pain worsening over the last week, causing sleep disturbances. No recent injury or aggravating factors identified. Pain is not associated with sciatica symptoms. Differential includes musculoskeletal pain and possible involvement of soft tissue masses. - Order right hip x-ray - Prescribe muscle relaxers to be taken at night - Advise against taking  naproxen, Aleve, ibuprofen , or Motrin  while on prednisone  - Recommend alternating with Tylenol for pain management - Suggest using Voltaren gel or lidocaine patch for localized pain relief - Advise using a donut pillow for tailbone support while sitting - Provide stretching exercises - Consider referral to ortho spine if symptoms persist  Tailbone (coccyx) pain Pain in the tailbone area, described as acute on chronic. No history of trauma or injury to the area. Pain exacerbated by sitting and certain movements. - Order sacral x-ray - Recommend using a  donut pillow for tailbone support while sitting  Lower back pain with palpable soft tissue masses Chronic lower back pain with palpable soft tissue masses across the lower back. Masses are tender and have been present for several years. Previous chiropractic treatment was ineffective. - Order ultrasound of soft tissue masses - Order lower back x-ray - Recommend heat application for pain relief - Advise against further chiropractic treatment until further evaluation  Numbness of right hand fingers Numbness in the fingers of the right hand, occurring primarily in the mornings. No associated wrist or tendon pain. Possible contribution from vitamin B12 deficiency. - Order blood work to recheck vitamin B12 levels - Advise restarting vitamin B12 supplementation at 1000 mcg daily  Vitamin B12 deficiency Previously diagnosed vitamin B12 deficiency with levels significantly below normal. Potential contributor to neurological symptoms such as numbness. - Advise restarting vitamin B12 supplementation at 1000 mcg daily - Order blood work to recheck vitamin B12 levels  Recording duration: 17 minutes      Return in about 1 month (around 08/27/2024) for follow up back pain .     Ginger Patrick, MSN, APRN, FNP-C [facility]

## 2024-07-29 ENCOUNTER — Encounter: Payer: Self-pay | Admitting: *Deleted

## 2024-08-02 ENCOUNTER — Ambulatory Visit: Payer: Self-pay | Admitting: Family

## 2024-08-05 ENCOUNTER — Ambulatory Visit

## 2024-08-05 DIAGNOSIS — E559 Vitamin D deficiency, unspecified: Secondary | ICD-10-CM | POA: Diagnosis not present

## 2024-08-05 MED ORDER — CYANOCOBALAMIN 1000 MCG/ML IJ SOLN
1000.0000 ug | Freq: Once | INTRAMUSCULAR | Status: AC
  Administered 2024-08-05: 1000 ug via INTRAMUSCULAR

## 2024-08-05 NOTE — Progress Notes (Signed)
 After obtaining consent, and per orders of Ginger Patrick, NP, injection of B-12 given IM in right deltoid by Sebastian Rosina Helling. Patient tolerated injection well and has next 2 nurse visits scheduled.

## 2024-08-12 ENCOUNTER — Ambulatory Visit: Payer: Self-pay

## 2024-08-12 NOTE — Telephone Encounter (Signed)
 FYI Only or Action Required?: FYI only for provider.  Patient was last seen in primary care on 07/28/2024 by Corwin Antu, FNP.  Called Nurse Triage reporting Cough and Muscle Pain.  Symptoms began a week ago.  Interventions attempted: Rest, hydration, or home remedies.  Symptoms are: unchanged.  Triage Disposition: See PCP When Office is Open (Within 3 Days)  Patient/caregiver understands and will follow disposition?: Yes    Copied from CRM #[id number]. Topic: Clinical - Red Word Triage >> Aug 12, 2024  9:14 AM Franky GRADE wrote: Red Word that prompted transfer to Nurse Triage: Patient is experiencing left side chest pain for about two weeks now, difficulty coughing due to the pain. Reason for Disposition  [1] MODERATE pain (e.g., interferes with normal activities) AND [2] present > 3 days  Answer Assessment - Initial Assessment Questions Additional info: Has referral for ultrasound of mass on her back. She is requesting to schedule.     1. MECHANISM: How did the injury happen?     stretching 2. ONSET: When did the injury happen? (e.g., minutes, hours, days ago)     2 weeks ago 3. LOCATION: Where on the chest is the injury located? Where does it hurt?     Left sided  4. CHEST OR RIB PAIN SEVERITY: Is there pain? If Yes, ask: How bad is the pain? (e.g., Scale 0-10; none, mild, moderate, severe)     At rest 7/10-feels bruised. Pain increases with cough and movement 5. BREATHING DIFFICULTY: Are you having any difficulty breathing? If Yes, ask: How bad is it?  (e.g., none, mild, moderate, severe)      Denies  6. OTHER SYMPTOMS (e.g., cough, fever, rash)      Recovering from upper respiratory infection. Cough makes pain worse 7. PREGNANCY: Is there any chance you are pregnant? When was your last menstrual period?  Protocols used: Chest Injury - Bending, Lifting, or Twisting-A-AH

## 2024-08-13 ENCOUNTER — Ambulatory Visit: Payer: Self-pay | Admitting: Family

## 2024-08-13 ENCOUNTER — Ambulatory Visit: Admitting: Family

## 2024-08-13 ENCOUNTER — Ambulatory Visit (INDEPENDENT_AMBULATORY_CARE_PROVIDER_SITE_OTHER)
Admission: RE | Admit: 2024-08-13 | Discharge: 2024-08-13 | Disposition: A | Discharge status: Home / Self Care | Source: Ambulatory Visit | Attending: Family | Admitting: Family

## 2024-08-13 ENCOUNTER — Encounter: Payer: Self-pay | Admitting: Family

## 2024-08-13 VITALS — BP 122/92 | HR 85 | Temp 98.8°F | Ht 64.0 in | Wt 145.4 lb

## 2024-08-13 DIAGNOSIS — N644 Mastodynia: Secondary | ICD-10-CM | POA: Diagnosis not present

## 2024-08-13 DIAGNOSIS — R0789 Other chest pain: Secondary | ICD-10-CM | POA: Diagnosis not present

## 2024-08-13 DIAGNOSIS — S299XXA Unspecified injury of thorax, initial encounter: Secondary | ICD-10-CM

## 2024-08-13 NOTE — Progress Notes (Signed)
 Established Patient Office Visit  Subjective:      CC:  Chief Complaint  Patient presents with   Acute Visit    Feels like she has pulled a muscle in her chest     HPI: Sophia Vance is a 58 y.o. female presenting on 08/13/2024 for Acute Visit (Feels like she has pulled a muscle in her chest ) .  Discussed the use of AI scribe software for clinical note transcription with the patient, who gave verbal consent to proceed.  History of Present Illness Sophia Vance is a 57 year old female who presents with left chest pain following a stretching incident.  She experienced significant left chest pain after a stretching incident on August 03, 2024, when a friend assisted her by pulling her leg across her chest, resulting in immediate pain under her left breast. The pain was initially mild but has worsened over the past few days, particularly since Monday morning when she could barely get up. The pain is localized to the area under her left breast, exacerbated by movement, deep breathing, and palpation, and is described as sensitive and tender to touch. No leg pain, no pop felt at the time of the incident, but she recalls saying 'ow' when it happened.  She has a history of similar pain in the same area several years ago, which was investigated with no significant findings. She has not had a mammogram in almost three years, with the last one being in 2023 at [facility]. No wheezing or heart-related chest pain, and she denies pain in the breast itself or any breast mass.  She has been experiencing a persistent cough, which she states is not new, and denies any headaches. Her eyes appear sensitive due to not wearing her glasses, which she carries with her. For pain management, she has taken Advil , which provided significant relief, and she also took a muscle relaxer before bed, which helped her feel better upon waking, although the pain persists.         Social  history:  Relevant past medical, surgical, family and social history reviewed and updated as indicated. Interim medical history since our last visit reviewed.  Allergies and medications reviewed and updated.  DATA REVIEWED: CHART IN EPIC     ROS: Negative unless specifically indicated above in HPI.    Current Outpatient Medications:    albuterol  (VENTOLIN  HFA) 108 (90 Base) MCG/ACT inhaler, Inhale 2 puffs into the lungs every 6 (six) hours as needed for wheezing or shortness of breath., Disp: 8 g, Rfl: 0   ALPRAZolam  (XANAX ) 0.25 MG tablet, Take 1 tablet (0.25 mg total) by mouth daily as needed for anxiety., Disp: 30 tablet, Rfl: 0   buPROPion  (WELLBUTRIN  XL) 150 MG 24 hr tablet, Take 1 tablet (150 mg total) by mouth daily., Disp: 90 tablet, Rfl: 3   Cholecalciferol (VITAMIN D -3) 125 MCG (5000 UT) TABS, Take 1 tablet by mouth daily., Disp: , Rfl:    diphenhydrAMINE (BENADRYL) 25 MG tablet, Take 25 mg by mouth 2 (two) times daily as needed for allergies., Disp: , Rfl:    Docusate Calcium (STOOL SOFTENER PO), Take by mouth daily., Disp: , Rfl:    FLUoxetine  (PROZAC ) 20 MG capsule, Take 1 capsule (20 mg total) by mouth daily., Disp: 90 capsule, Rfl: 3   Fluticasone-Umeclidin-Vilant (TRELEGY ELLIPTA ) 100-62.5-25 MCG/ACT AEPB, Inhale 1 puff into the lungs daily., Disp: 1 each, Rfl: 2   ibuprofen  (ADVIL ,MOTRIN ) 200 MG tablet, Take 800 mg  by mouth every 6 (six) hours as needed for headache (pain)., Disp: , Rfl:    Magnesium Oxide 500 MG TABS, Take by mouth., Disp: , Rfl:    tiZANidine  (ZANAFLEX ) 4 MG tablet, 1/2 to one tablet po at bedtime prn muscle spasm, Disp: 30 tablet, Rfl: 0   valACYclovir  (VALTREX ) 1000 MG tablet, TAKE TWO TABLETS BY MOUTH TWO TIMES A DAY FOR ONE DAY FOR COLD SORE, Disp: 30 tablet, Rfl: 1        Objective:        BP (!) 122/92 (BP Location: Right Arm, Patient Position: Sitting, Cuff Size: Normal)   Pulse 85   Temp 98.8 F (37.1 C) (Temporal)   Ht 5' 4 (1.626  m)   Wt 145 lb 6.4 oz (66 kg)   LMP 03/18/2019 (Approximate)   SpO2 98%   BMI 24.96 kg/m   Physical Exam BREAST: Tenderness in the lower left breast, extending to the surrounding area.  Wt Readings from Last 3 Encounters:  08/13/24 145 lb 6.4 oz (66 kg)  07/28/24 148 lb 6.4 oz (67.3 kg)  07/27/24 147 lb 9.6 oz (67 kg)    Physical Exam Vitals reviewed.  Chest:     Chest wall: Tenderness present. No mass or crepitus.  Breasts:    Right: No inverted nipple.     Left: Tenderness (left lower outer breast) present. No inverted nipple.    Lymphadenopathy:     Upper Body:     Left upper body: No axillary adenopathy.          Results   Assessment & Plan:   Assessment and Plan Assessment & Plan Left lower breast and chest wall pain (suspected costochondritis/muscle strain) Intermittent left lower breast and chest wall pain exacerbated by movement and deep breathing, likely due to costochondritis or muscle strain following a stretching incident. Pain is sensitive to touch and improves with ibuprofen  and muscle relaxers. Differential includes costochondritis and muscle strain. Reassured by lack of cardiac symptoms and reproducibility of pain on palpation. - Order rib x-ray to rule out fracture or other abnormalities. - [facility] at [facility] to evaluate breast tissue and ensure no underlying pathology. - Advise continuation of ibuprofen  and muscle relaxers for a few days to manage pain and inflammation. - Provide education on costochondritis and its management.  Palpable soft tissue masses of lower back Palpable soft tissue masses in the lower back with previous x-rays showing no abnormalities. Imaging with ultrasound or MRI is recommended for further evaluation. - Encourage scheduling of ultrasound or MRI for further evaluation of lower back masses.       Return if symptoms worsen or fail to improve.     Ginger Patrick, MSN, APRN,  FNP-C [facility]

## 2024-08-13 NOTE — Patient Instructions (Signed)
 I have sent an electronic order over to your preferred location for the following:   []   Left diagnostic mammogram and left breast ultrasound  Please give this center a call to get scheduled at your convenience.   [x]   [facility]      [address] [place], [place]        [phone]         Make sure to wear two piece  clothing  No lotions powders or deodorants the day of the appointment Make sure to bring picture ID and insurance card.  Bring list of medications you are currently taking including any supplements.

## 2024-08-19 ENCOUNTER — Other Ambulatory Visit: Payer: Self-pay | Admitting: Family

## 2024-08-19 DIAGNOSIS — M47816 Spondylosis without myelopathy or radiculopathy, lumbar region: Secondary | ICD-10-CM

## 2024-08-19 DIAGNOSIS — G8929 Other chronic pain: Secondary | ICD-10-CM

## 2024-08-19 DIAGNOSIS — R222 Localized swelling, mass and lump, trunk: Secondary | ICD-10-CM

## 2024-08-20 ENCOUNTER — Ambulatory Visit

## 2024-08-24 ENCOUNTER — Ambulatory Visit
Admission: RE | Admit: 2024-08-24 | Discharge: 2024-08-24 | Disposition: A | Source: Ambulatory Visit | Attending: Family

## 2024-08-24 ENCOUNTER — Ambulatory Visit
Admission: RE | Admit: 2024-08-24 | Discharge: 2024-08-24 | Disposition: A | Discharge status: Home / Self Care | Source: Ambulatory Visit | Attending: Family | Admitting: Family

## 2024-08-24 DIAGNOSIS — N644 Mastodynia: Secondary | ICD-10-CM

## 2024-09-09 ENCOUNTER — Ambulatory Visit (INDEPENDENT_AMBULATORY_CARE_PROVIDER_SITE_OTHER)

## 2024-09-09 DIAGNOSIS — E538 Deficiency of other specified B group vitamins: Secondary | ICD-10-CM | POA: Diagnosis not present

## 2024-09-09 MED ORDER — CYANOCOBALAMIN 1000 MCG/ML IJ SOLN
1000.0000 ug | Freq: Once | INTRAMUSCULAR | Status: AC
  Administered 2024-09-09: 1000 ug via INTRAMUSCULAR

## 2024-09-09 NOTE — Progress Notes (Signed)
 Per orders of Ginger Patrick, NP, injection of vitamin b 12 given by Laray Arenas in right deltoid (pt request) Patient tolerated injection well. Patient will make appointment for 1 month. Pt is also taking Vitamin b 12 OTC po most every day. Pt said she was going to do better taking OTC vitamin b 12 daily.

## 2024-09-10 ENCOUNTER — Encounter: Payer: Self-pay | Admitting: Family

## 2024-09-10 DIAGNOSIS — G8929 Other chronic pain: Secondary | ICD-10-CM | POA: Insufficient documentation

## 2024-09-10 DIAGNOSIS — R222 Localized swelling, mass and lump, trunk: Secondary | ICD-10-CM | POA: Insufficient documentation

## 2024-09-10 DIAGNOSIS — M47816 Spondylosis without myelopathy or radiculopathy, lumbar region: Secondary | ICD-10-CM | POA: Insufficient documentation

## 2024-10-08 ENCOUNTER — Ambulatory Visit

## 2024-10-14 ENCOUNTER — Ambulatory Visit
